# Patient Record
Sex: Female | Born: 1995 | Race: Asian | Hispanic: No | Marital: Married | Smoking: Never smoker
Health system: Southern US, Community
[De-identification: ages and names within clinical notes are randomized; demographics above are authoritative.]

## PROBLEM LIST (undated history)

## (undated) DIAGNOSIS — E079 Disorder of thyroid, unspecified: Secondary | ICD-10-CM

## (undated) HISTORY — DX: Disorder of thyroid, unspecified: E07.9

## (undated) HISTORY — PX: NO PAST SURGERIES: SHX2092

---
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
## 2013-12-30 ENCOUNTER — Other Ambulatory Visit: Payer: Self-pay

## 2013-12-30 ENCOUNTER — Other Ambulatory Visit: Payer: Self-pay | Admitting: Internal Medicine

## 2013-12-30 DIAGNOSIS — N6320 Unspecified lump in the left breast, unspecified quadrant: Secondary | ICD-10-CM

## 2013-12-30 DIAGNOSIS — N644 Mastodynia: Secondary | ICD-10-CM

## 2014-01-01 ENCOUNTER — Ambulatory Visit
Admission: RE | Admit: 2014-01-01 | Discharge: 2014-01-01 | Disposition: A | Discharge status: Home / Self Care | Payer: BC Managed Care – PPO | Source: Ambulatory Visit | Attending: Internal Medicine | Admitting: Internal Medicine

## 2014-01-01 ENCOUNTER — Other Ambulatory Visit: Payer: Self-pay

## 2014-01-01 ENCOUNTER — Encounter (INDEPENDENT_AMBULATORY_CARE_PROVIDER_SITE_OTHER): Payer: Self-pay

## 2014-01-01 DIAGNOSIS — N6320 Unspecified lump in the left breast, unspecified quadrant: Secondary | ICD-10-CM

## 2014-01-01 DIAGNOSIS — N644 Mastodynia: Secondary | ICD-10-CM

## 2014-04-20 ENCOUNTER — Other Ambulatory Visit: Payer: Self-pay | Admitting: Internal Medicine

## 2014-04-20 DIAGNOSIS — N644 Mastodynia: Secondary | ICD-10-CM

## 2014-04-20 DIAGNOSIS — N63 Unspecified lump in unspecified breast: Secondary | ICD-10-CM

## 2014-04-22 ENCOUNTER — Ambulatory Visit
Admission: RE | Admit: 2014-04-22 | Discharge: 2014-04-22 | Disposition: A | Discharge status: Home / Self Care | Payer: BLUE CROSS/BLUE SHIELD | Source: Ambulatory Visit | Attending: Internal Medicine | Admitting: Internal Medicine

## 2014-04-22 DIAGNOSIS — N644 Mastodynia: Secondary | ICD-10-CM

## 2014-04-22 DIAGNOSIS — N63 Unspecified lump in unspecified breast: Secondary | ICD-10-CM

## 2014-07-03 ENCOUNTER — Other Ambulatory Visit: Payer: Self-pay | Admitting: Internal Medicine

## 2014-07-03 ENCOUNTER — Ambulatory Visit
Admission: RE | Admit: 2014-07-03 | Discharge: 2014-07-03 | Disposition: A | Discharge status: Home / Self Care | Payer: BLUE CROSS/BLUE SHIELD | Source: Ambulatory Visit | Attending: Internal Medicine | Admitting: Internal Medicine

## 2014-07-03 ENCOUNTER — Other Ambulatory Visit: Payer: Self-pay

## 2014-07-03 ENCOUNTER — Encounter (INDEPENDENT_AMBULATORY_CARE_PROVIDER_SITE_OTHER): Payer: Self-pay

## 2014-07-03 DIAGNOSIS — D242 Benign neoplasm of left breast: Secondary | ICD-10-CM

## 2015-05-26 ENCOUNTER — Other Ambulatory Visit: Payer: Self-pay | Admitting: Internal Medicine

## 2015-05-26 DIAGNOSIS — N6320 Unspecified lump in the left breast, unspecified quadrant: Secondary | ICD-10-CM

## 2015-06-02 ENCOUNTER — Ambulatory Visit
Admission: RE | Admit: 2015-06-02 | Discharge: 2015-06-02 | Disposition: A | Discharge status: Home / Self Care | Payer: BLUE CROSS/BLUE SHIELD | Source: Ambulatory Visit | Attending: Internal Medicine | Admitting: Internal Medicine

## 2015-06-02 DIAGNOSIS — N6320 Unspecified lump in the left breast, unspecified quadrant: Secondary | ICD-10-CM

## 2015-10-02 ENCOUNTER — Ambulatory Visit (INDEPENDENT_AMBULATORY_CARE_PROVIDER_SITE_OTHER): Payer: BLUE CROSS/BLUE SHIELD

## 2015-10-02 ENCOUNTER — Ambulatory Visit (INDEPENDENT_AMBULATORY_CARE_PROVIDER_SITE_OTHER): Payer: BLUE CROSS/BLUE SHIELD | Admitting: Internal Medicine

## 2015-10-02 VITALS — BP 120/76 | HR 96 | Temp 98.4°F | Resp 18 | Ht 64.0 in | Wt 141.0 lb

## 2015-10-02 DIAGNOSIS — M5412 Radiculopathy, cervical region: Secondary | ICD-10-CM

## 2015-10-02 DIAGNOSIS — E039 Hypothyroidism, unspecified: Secondary | ICD-10-CM | POA: Diagnosis not present

## 2015-10-02 MED ORDER — CYCLOBENZAPRINE HCL 10 MG PO TABS: 10.0000 mg | ORAL_TABLET | Freq: Every day | ORAL | Status: DC

## 2015-10-02 MED ORDER — MELOXICAM 15 MG PO TABS: 15.0000 mg | ORAL_TABLET | Freq: Every day | ORAL | Status: DC

## 2015-10-02 NOTE — Patient Instructions (Addendum)
Heating pad for neck 3 times a day for 2-3 days Wear collar for comfort    IF you received an x-ray today, you will receive an invoice from [facility]. Please contact [facility] at [phone] with questions or concerns regarding your invoice.   IF you received labwork today, you will receive an invoice from [employer]. Please contact [facility] at [phone] with questions or concerns regarding your invoice.   Our billing staff will not be able to assist you with questions regarding bills from these companies.  You will be contacted with the lab results as soon as they are available. The fastest way to get your results is to activate your My Chart account. Instructions are located on the last page of this paperwork. If you have not heard from [place] regarding the results in 2 weeks, please contact this office.
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
## 2015-12-06 ENCOUNTER — Other Ambulatory Visit: Payer: Self-pay | Admitting: Internal Medicine

## 2015-12-06 DIAGNOSIS — N63 Unspecified lump in unspecified breast: Secondary | ICD-10-CM

## 2016-01-04 ENCOUNTER — Ambulatory Visit
Admission: RE | Admit: 2016-01-04 | Discharge: 2016-01-04 | Disposition: A | Discharge status: Home / Self Care | Payer: BLUE CROSS/BLUE SHIELD | Source: Ambulatory Visit | Attending: Internal Medicine | Admitting: Internal Medicine

## 2016-01-04 DIAGNOSIS — N63 Unspecified lump in unspecified breast: Secondary | ICD-10-CM

## 2018-04-03 ENCOUNTER — Other Ambulatory Visit: Payer: Self-pay | Admitting: Internal Medicine

## 2018-04-03 DIAGNOSIS — N6320 Unspecified lump in the left breast, unspecified quadrant: Secondary | ICD-10-CM

## 2018-04-16 ENCOUNTER — Ambulatory Visit
Admission: RE | Admit: 2018-04-16 | Discharge: 2018-04-16 | Disposition: A | Discharge status: Home / Self Care | Payer: BLUE CROSS/BLUE SHIELD | Source: Ambulatory Visit | Attending: Internal Medicine | Admitting: Internal Medicine

## 2018-04-16 DIAGNOSIS — N6320 Unspecified lump in the left breast, unspecified quadrant: Secondary | ICD-10-CM

## 2018-10-08 ENCOUNTER — Other Ambulatory Visit: Payer: Self-pay | Admitting: Gastroenterology

## 2018-10-08 DIAGNOSIS — R1084 Generalized abdominal pain: Secondary | ICD-10-CM

## 2018-10-08 DIAGNOSIS — R140 Abdominal distension (gaseous): Secondary | ICD-10-CM

## 2018-10-10 ENCOUNTER — Ambulatory Visit
Admission: RE | Admit: 2018-10-10 | Discharge: 2018-10-10 | Disposition: A | Discharge status: Home / Self Care | Payer: BC Managed Care – PPO | Source: Ambulatory Visit | Attending: Gastroenterology | Admitting: Gastroenterology

## 2018-10-10 DIAGNOSIS — R140 Abdominal distension (gaseous): Secondary | ICD-10-CM

## 2018-10-10 DIAGNOSIS — R1084 Generalized abdominal pain: Secondary | ICD-10-CM

## 2019-06-19 ENCOUNTER — Ambulatory Visit: Payer: BC Managed Care – PPO | Attending: Internal Medicine

## 2019-06-19 DIAGNOSIS — Z23 Encounter for immunization: Secondary | ICD-10-CM

## 2019-07-14 ENCOUNTER — Ambulatory Visit: Payer: BC Managed Care – PPO | Attending: Internal Medicine

## 2019-07-14 DIAGNOSIS — Z23 Encounter for immunization: Secondary | ICD-10-CM

## 2019-07-14 NOTE — Progress Notes (Signed)
   Covid-19 Vaccination Clinic  Name:  Allayah Egerer    MRN: [id number] DOB: 1996/01/10  07/14/2019  Ms. Elizardo was observed post Covid-19 immunization for 15 minutes without incident. She was provided with Vaccine Information Sheet and instruction to access the V-Safe system.   Ms. Theilen was instructed to call 911 with any severe reactions post vaccine: Marland Kitchen Difficulty breathing  . Swelling of face and throat  . A fast heartbeat  . A bad rash all over body  . Dizziness and weakness   Immunizations Administered    Name Date Dose VIS Date Route   Pfizer COVID-19 Vaccine 07/14/2019  1:06 PM 0.3 mL 05/21/2018 Intramuscular   Manufacturer: [place]   Lot: [id number]   [place]: [id number]

## 2019-12-03 IMAGING — US ULTRASOUND LEFT BREAST LIMITED
1 series · 4 of 4 positions shown · non-contrast
Comparison: Previous exam(s).

CLINICAL DATA: Patient presents for recheck of a known palpable
mass in the left breast, evaluated between December 2013 and December 2015 with ultrasound, diagnosed as a benign fibroadenoma.

EXAM:
ULTRASOUND OF THE LEFT BREAST

[Series 1: ultrasound left breast limited · 0.05mm/px · 4 of 4 slices shown]
[im 1/4]
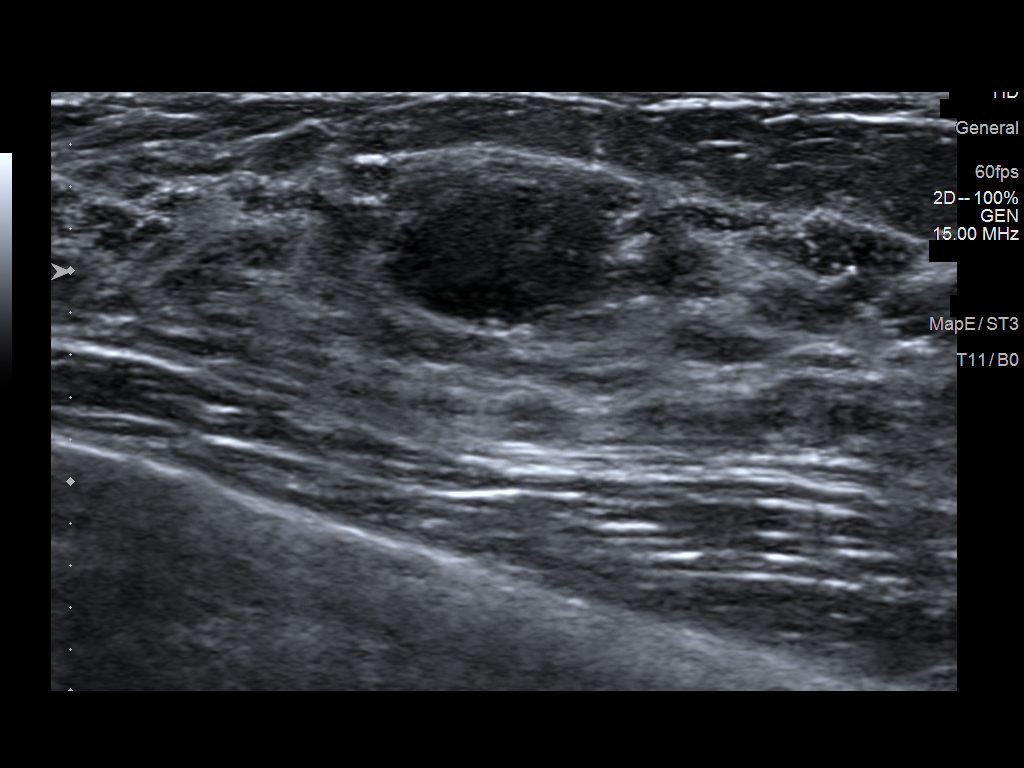
[im 2/4]
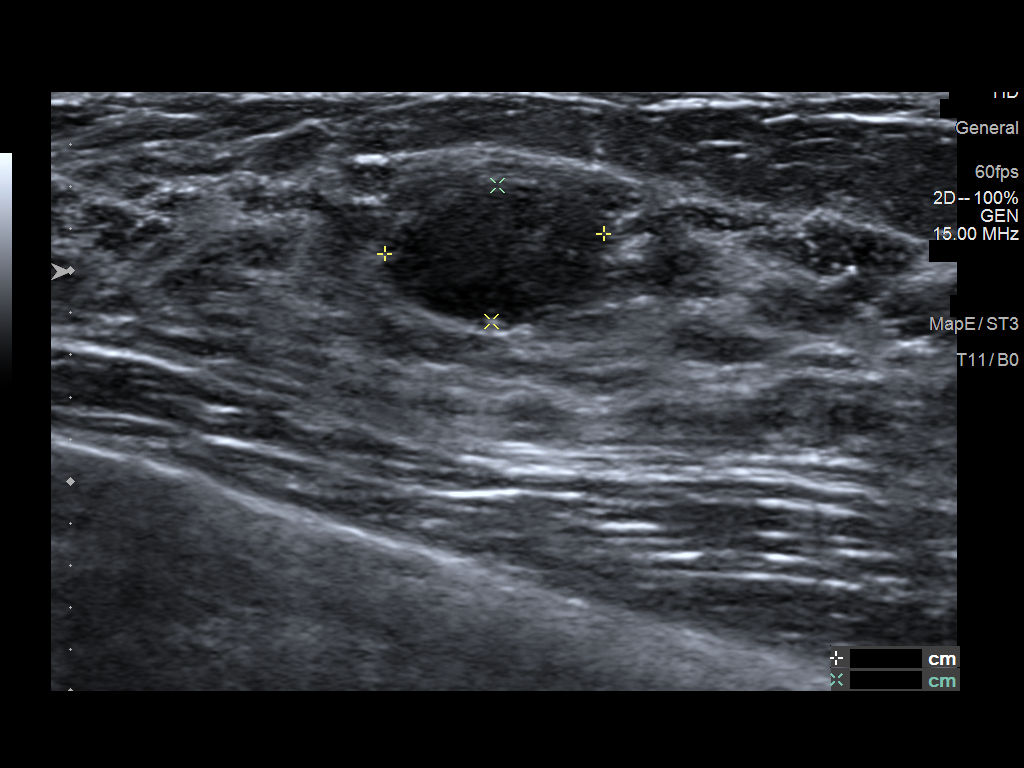
[im 3/4]
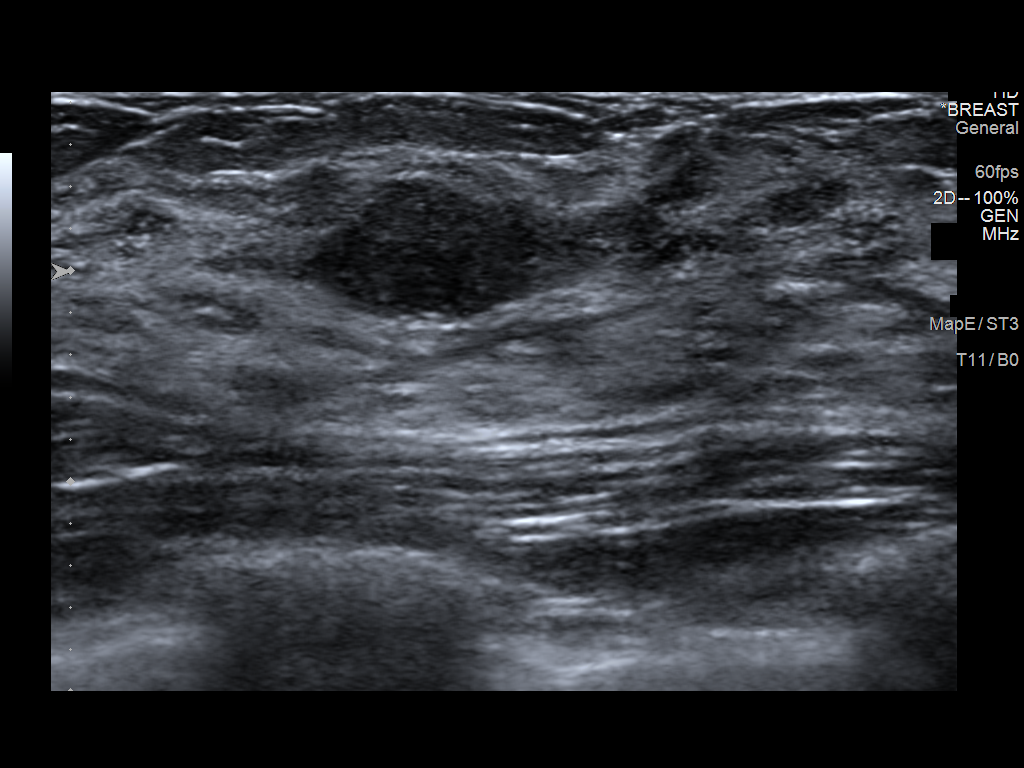
[im 4/4]
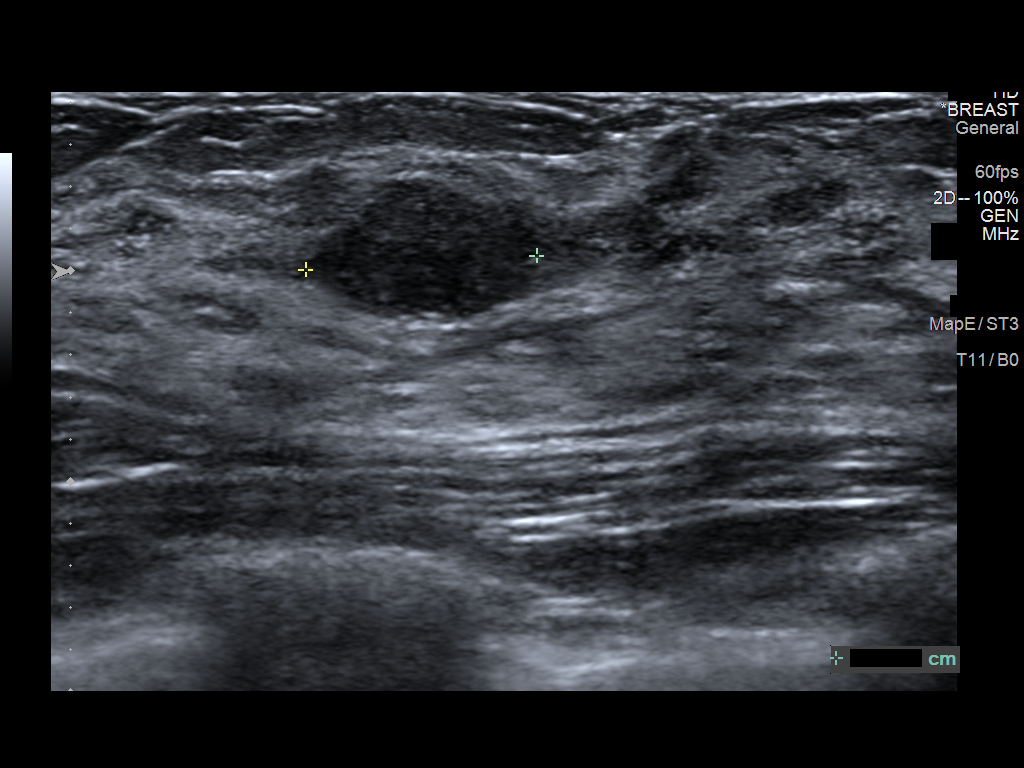

[4 of 4 positions shown; findings below may reference images not displayed]

FINDINGS: Targeted ultrasound is performed, showing oval, parallel, hypoechoic
circumscribed mass left breast at 11 o'clock, 4 cm the nipple,
measuring 10 x 7 x 11 mm, decreased in size from 19 x 8 x 13 mm in
December 2013.
IMPRESSION: Benign left breast fibroadenoma, decreased in size from the initial
exam dated 01/01/2014.

RECOMMENDATION:
Screening mammogram at age 38 unless there are persistent or
intervening clinical concerns. (Code:K5-R-DVS)

I have discussed the findings and recommendations with the patient.
Results were also provided in writing at the conclusion of the
visit. If applicable, a reminder letter will be sent to the patient
regarding the next appointment.

BI-RADS CATEGORY  2: Benign.

## 2020-07-28 IMAGING — US ULTRASOUND ABDOMEN COMPLETE
1 series · 14 of 25 positions shown · non-contrast
Comparison: None.

CLINICAL DATA: Abdominal pain and bloating 2 months.

EXAM:
ABDOMEN ULTRASOUND COMPLETE

[Series 1: ultrasound abdomen complete · 0.11mm/px · 14 of 92 slices shown]
[im 1/92]
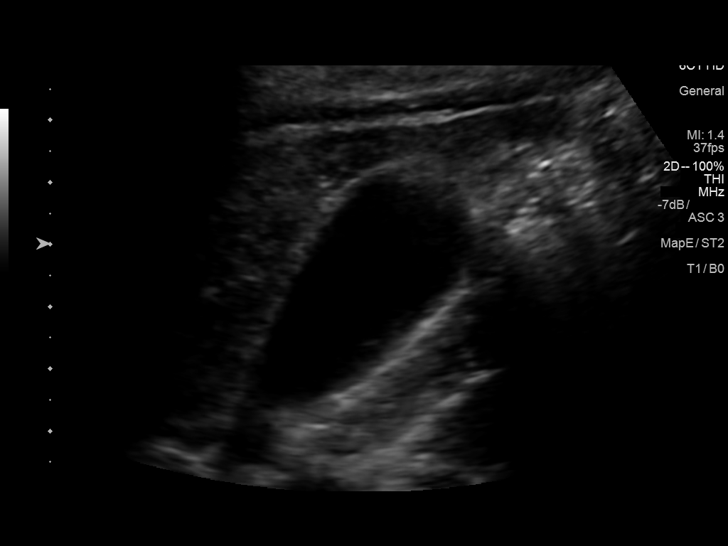
[im 8/92]
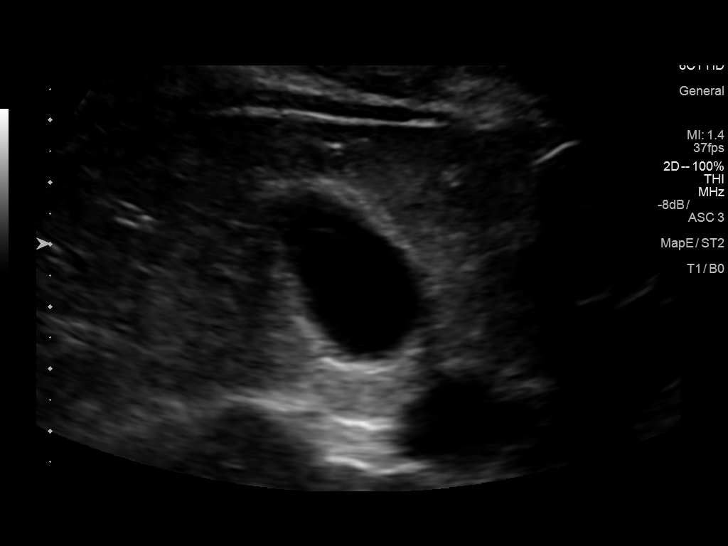
[im 16/92]
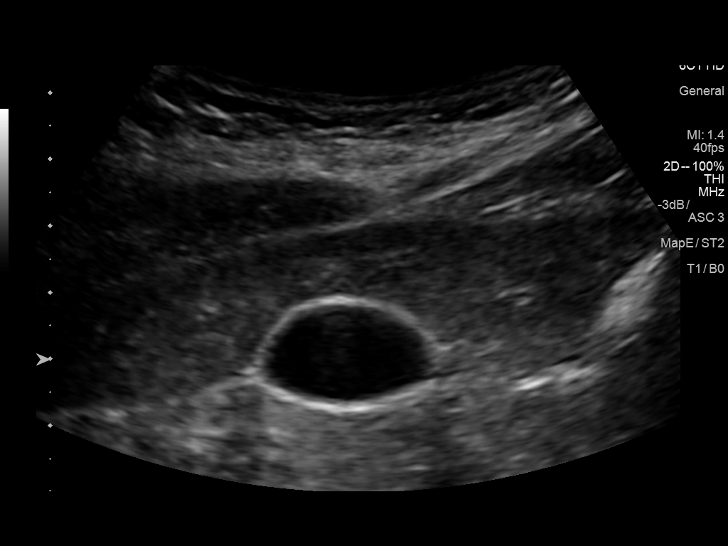
[im 23/92]
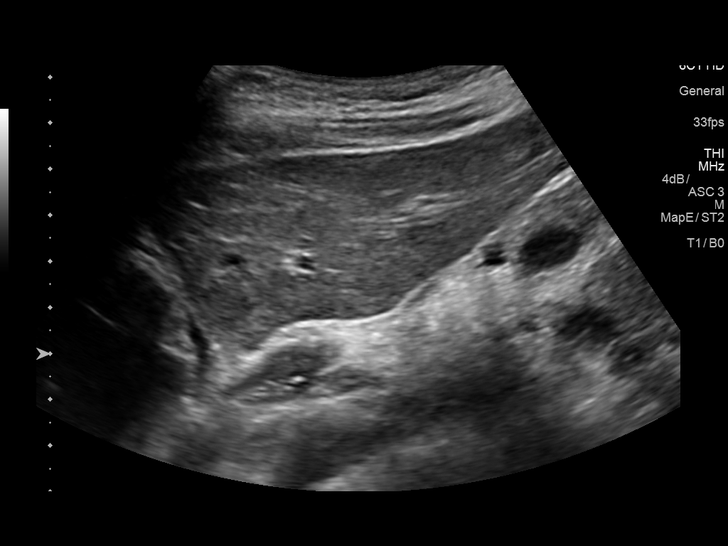
[im 31/92]
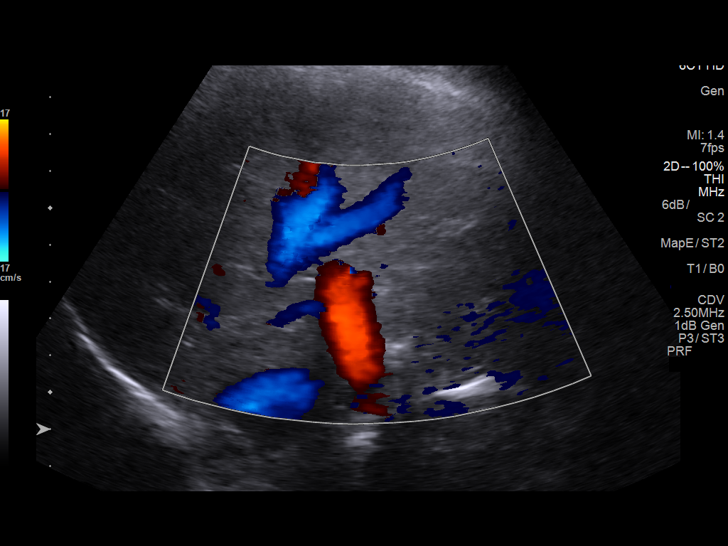
[im 35/92]
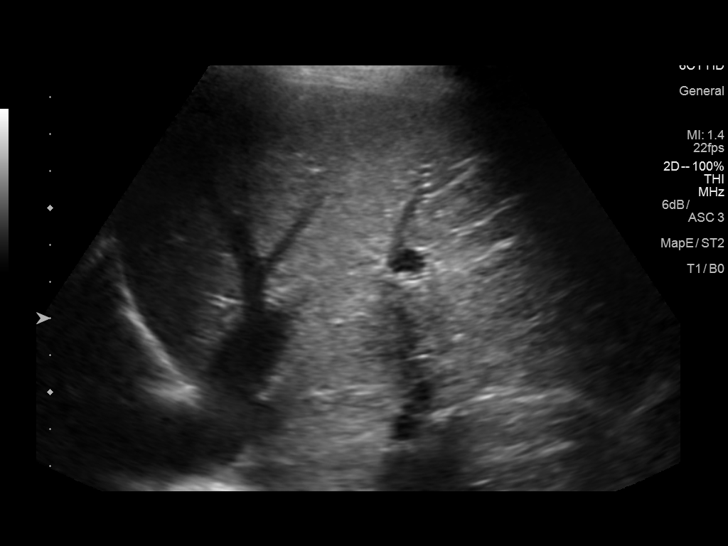
[im 42/92]
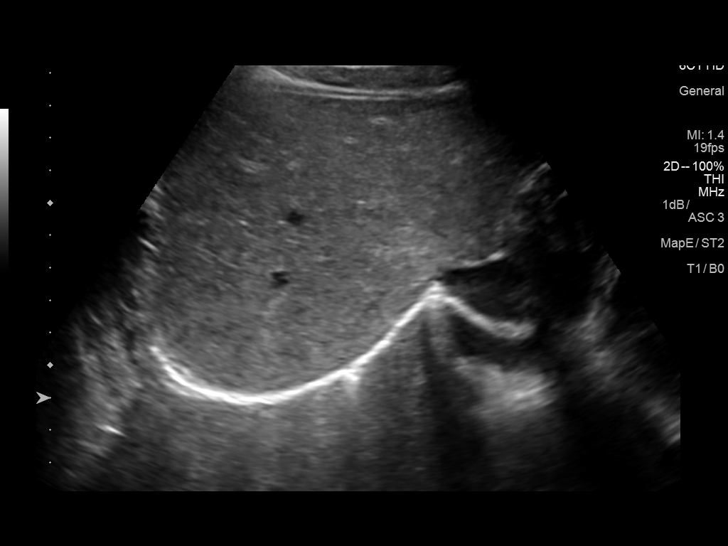
[im 50/92]
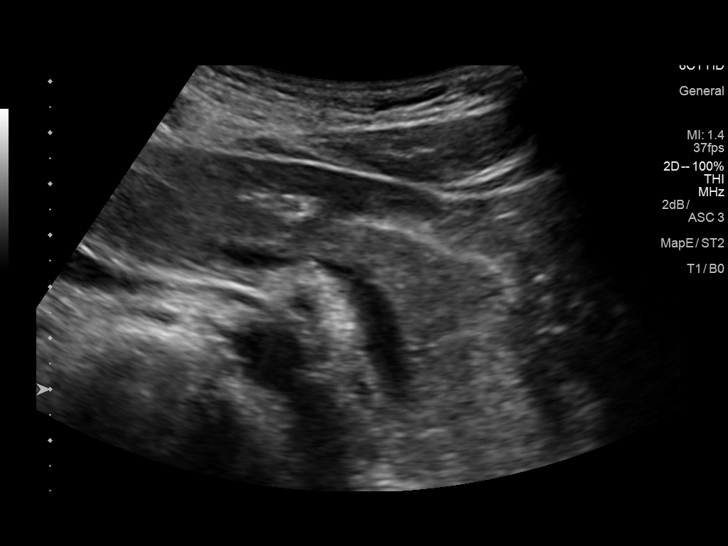
[im 57/92]
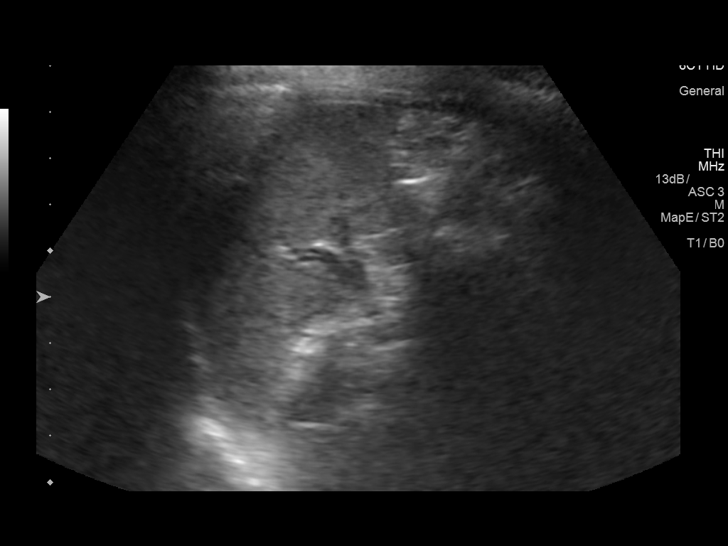
[im 61/92]
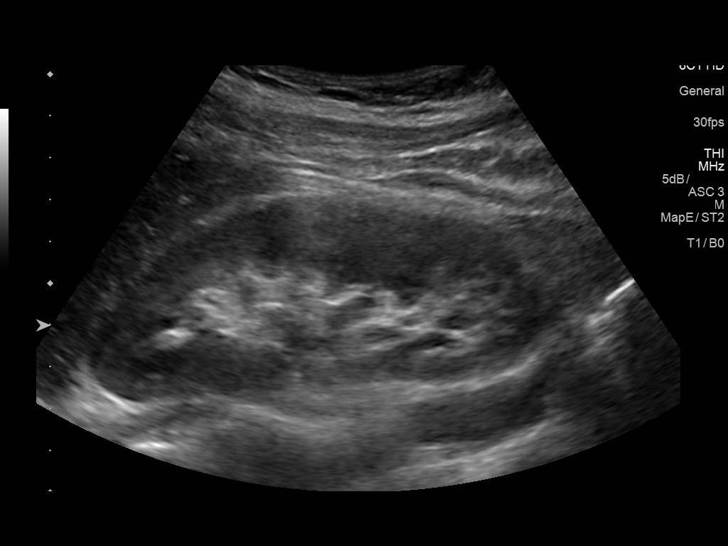
[im 69/92]
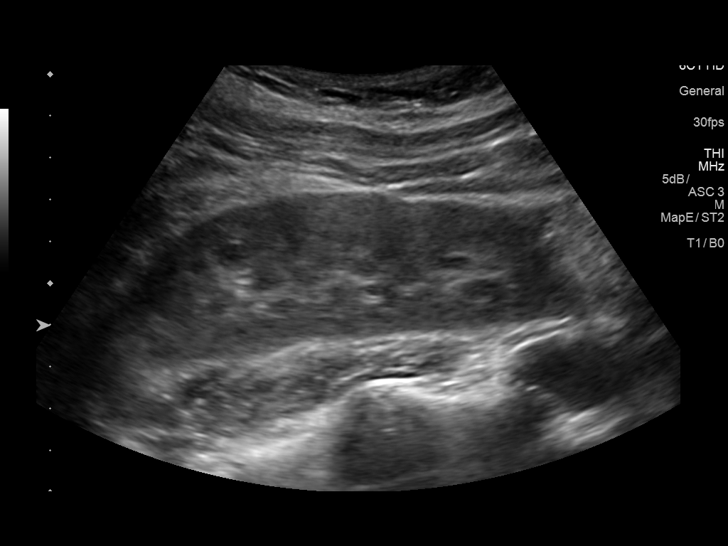
[im 76/92]
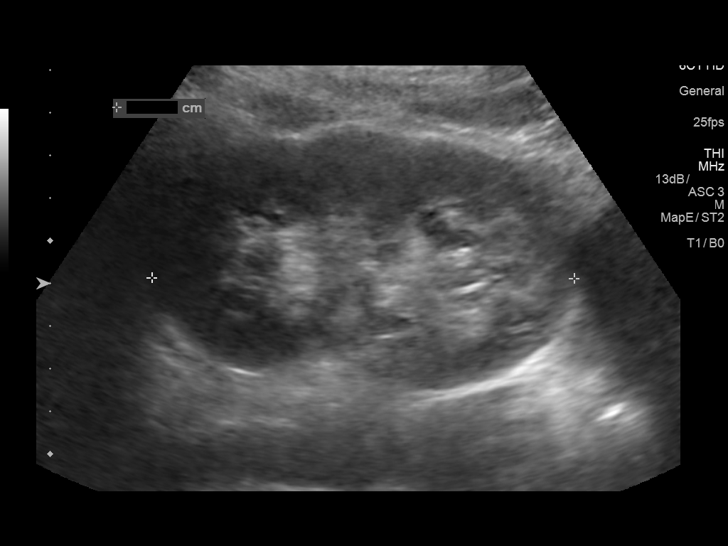
[im 84/92]
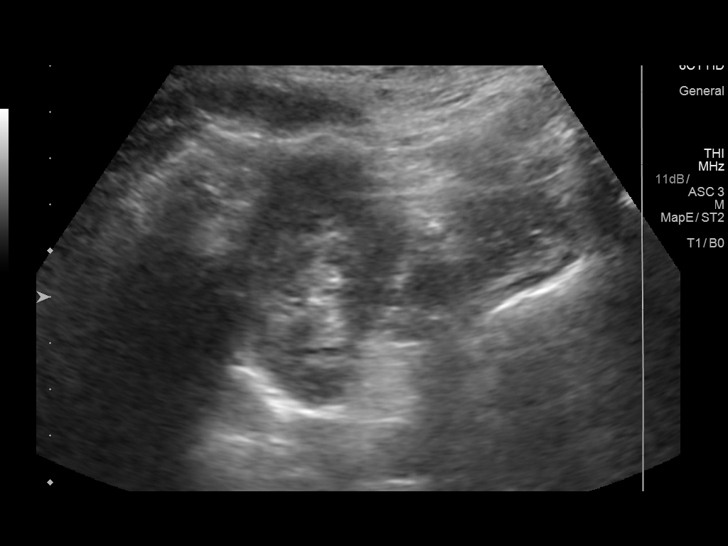
[im 92/92]
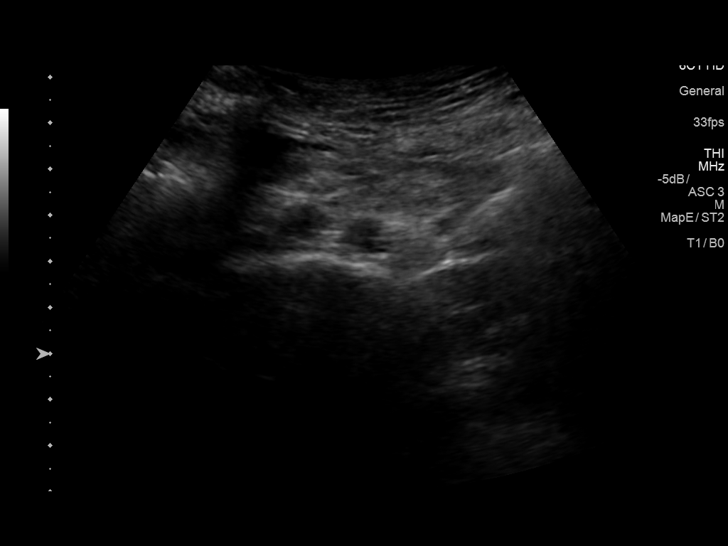

[14 of 25 positions shown; findings below may reference images not displayed]

FINDINGS: Gallbladder: No gallstones or wall thickening visualized. No
sonographic Murphy sign noted by sonographer.

Common bile duct: Diameter: 2.2 mm.

Liver: No focal lesion identified. Within normal limits in
parenchymal echogenicity. Portal vein is patent on color Doppler
imaging with normal direction of blood flow towards the liver.

IVC: No abnormality visualized.

Pancreas: Visualized portion unremarkable.

Spleen: Size and appearance within normal limits.

Right Kidney: Length: 11.1 cm. Echogenicity within normal limits. No
mass or hydronephrosis visualized.

Left Kidney: Length: 9.9 cm. Echogenicity within normal limits. No
mass or hydronephrosis visualized.

Abdominal aorta: No aneurysm visualized.

Other findings: None.
IMPRESSION: Normal abdominal ultrasound.

## 2021-04-06 ENCOUNTER — Other Ambulatory Visit: Payer: Self-pay | Admitting: Internal Medicine

## 2021-04-06 DIAGNOSIS — N6320 Unspecified lump in the left breast, unspecified quadrant: Secondary | ICD-10-CM

## 2021-04-06 DIAGNOSIS — N6310 Unspecified lump in the right breast, unspecified quadrant: Secondary | ICD-10-CM

## 2021-05-10 ENCOUNTER — Other Ambulatory Visit: Payer: BC Managed Care – PPO

## 2021-05-17 ENCOUNTER — Ambulatory Visit
Admission: RE | Admit: 2021-05-17 | Discharge: 2021-05-17 | Disposition: A | Discharge status: Home / Self Care | Payer: BC Managed Care – PPO | Source: Ambulatory Visit | Attending: Internal Medicine | Admitting: Internal Medicine

## 2021-05-17 ENCOUNTER — Ambulatory Visit
Admission: RE | Admit: 2021-05-17 | Discharge: 2021-05-17 | Disposition: A | Discharge status: Home / Self Care | Payer: Self-pay | Source: Ambulatory Visit | Attending: Internal Medicine | Admitting: Internal Medicine

## 2021-05-17 ENCOUNTER — Other Ambulatory Visit: Payer: Self-pay

## 2021-05-17 ENCOUNTER — Other Ambulatory Visit: Payer: Self-pay | Admitting: Internal Medicine

## 2021-05-17 DIAGNOSIS — N6310 Unspecified lump in the right breast, unspecified quadrant: Secondary | ICD-10-CM

## 2021-05-17 DIAGNOSIS — N6320 Unspecified lump in the left breast, unspecified quadrant: Secondary | ICD-10-CM

## 2021-11-16 ENCOUNTER — Inpatient Hospital Stay: Admission: RE | Admit: 2021-11-16 | Payer: BC Managed Care – PPO | Source: Ambulatory Visit

## 2021-11-16 ENCOUNTER — Other Ambulatory Visit: Payer: BC Managed Care – PPO

## 2021-12-14 ENCOUNTER — Ambulatory Visit
Admission: RE | Admit: 2021-12-14 | Discharge: 2021-12-14 | Disposition: A | Discharge status: Home / Self Care | Payer: BC Managed Care – PPO | Source: Ambulatory Visit | Attending: Internal Medicine | Admitting: Internal Medicine

## 2021-12-14 ENCOUNTER — Other Ambulatory Visit: Payer: Self-pay | Admitting: Internal Medicine

## 2021-12-14 DIAGNOSIS — N6320 Unspecified lump in the left breast, unspecified quadrant: Secondary | ICD-10-CM

## 2021-12-14 DIAGNOSIS — N6310 Unspecified lump in the right breast, unspecified quadrant: Secondary | ICD-10-CM

## 2022-06-12 ENCOUNTER — Other Ambulatory Visit: Payer: Self-pay | Admitting: Internal Medicine

## 2022-06-12 ENCOUNTER — Encounter: Payer: Self-pay | Admitting: Internal Medicine

## 2022-06-12 DIAGNOSIS — N6320 Unspecified lump in the left breast, unspecified quadrant: Secondary | ICD-10-CM

## 2022-06-12 DIAGNOSIS — N6310 Unspecified lump in the right breast, unspecified quadrant: Secondary | ICD-10-CM

## 2022-06-16 ENCOUNTER — Other Ambulatory Visit: Payer: BC Managed Care – PPO

## 2023-01-03 IMAGING — US US BREAST*R* LIMITED INC AXILLA
1 series · 8 of 8 positions shown · non-contrast
Comparison: Previous LEFT breast ultrasound dated 04/16/2018.

CLINICAL DATA: Patient describes having an outside study in [REDACTED]
which described a breast cyst with recommendation to follow-up after
returning to the [place]. Patient presents today for that
follow-up exam. Patient does not have the outside images or report.
Today's requisition from the ordering physician describes palpable
lumps in the upper breasts bilaterally.

History of a benign LEFT breast fibroadenoma described on diagnostic
ultrasound report dated 04/16/2018.
EXAM:
ULTRASOUND OF THE BILATERAL BREAST

[Series 1: us breast*right* limited inc axilla · 0.06mm/px · 8 of 8 slices shown]
[im 1/8]
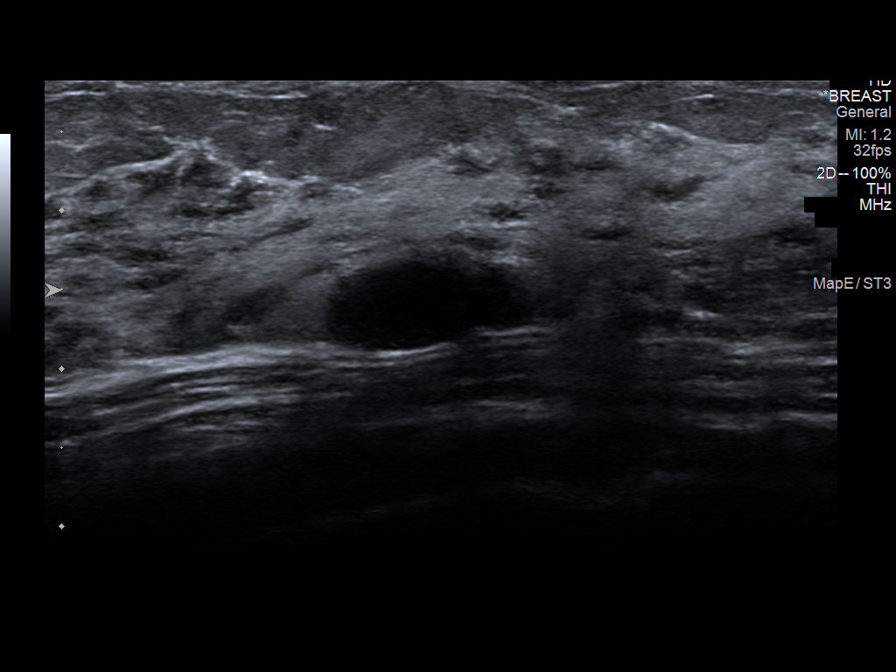
[im 2/8]
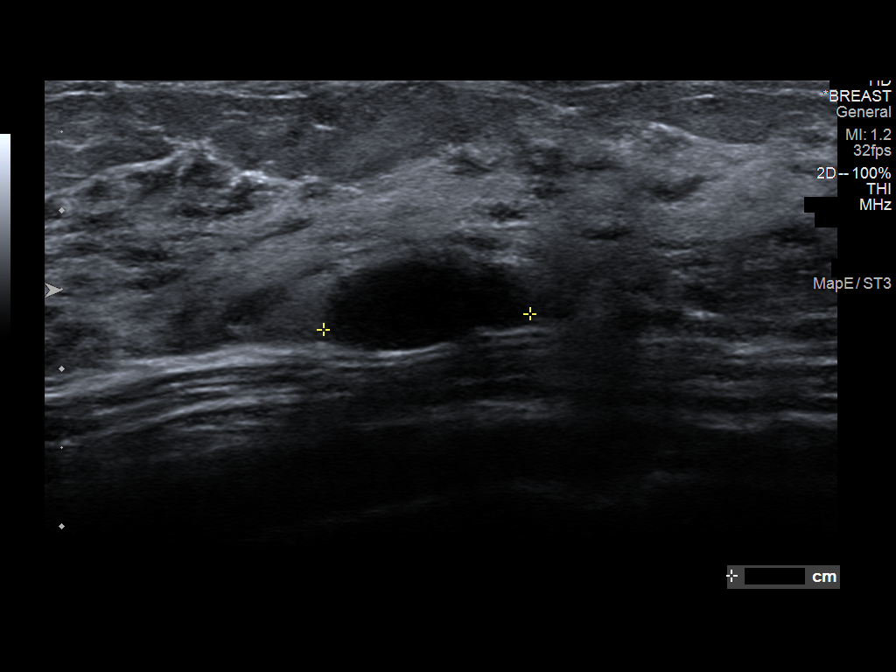
[im 3/8]
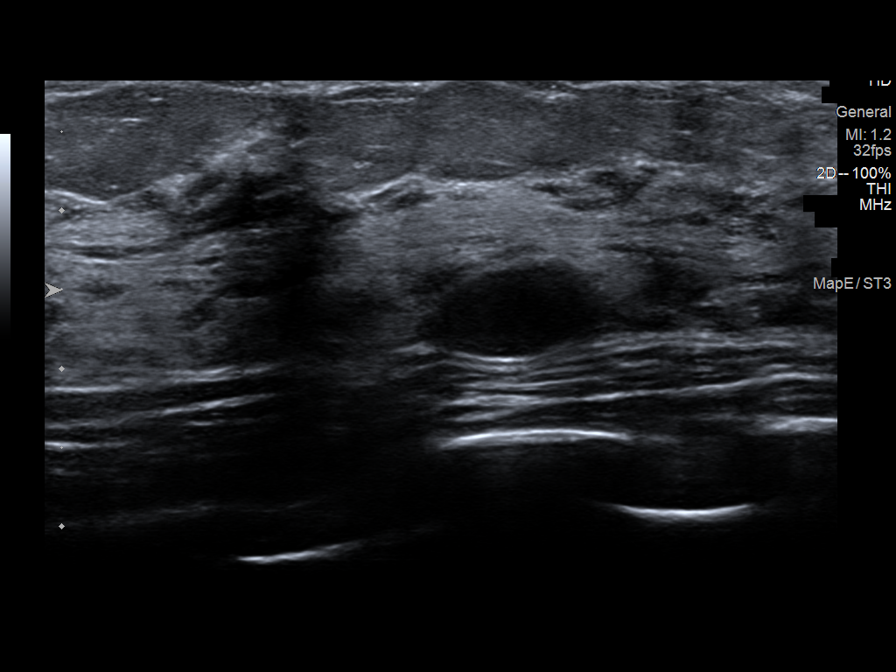
[im 4/8]
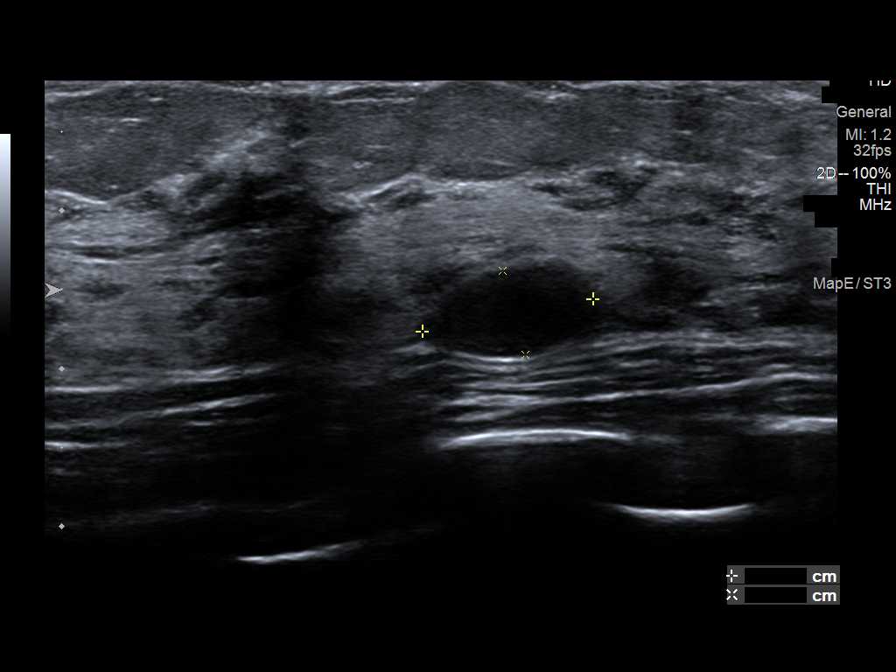
[im 5/8]
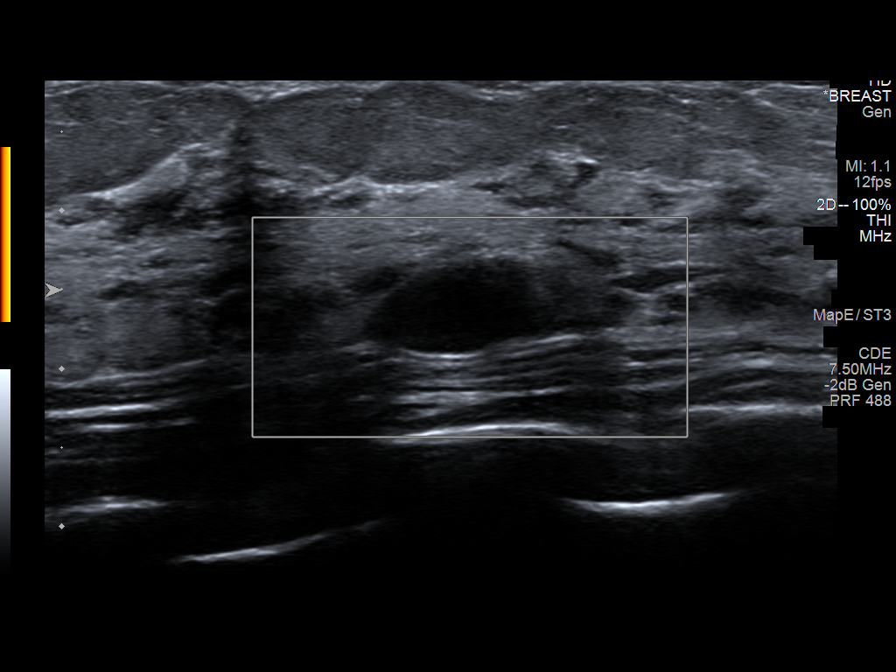
[im 6/8]
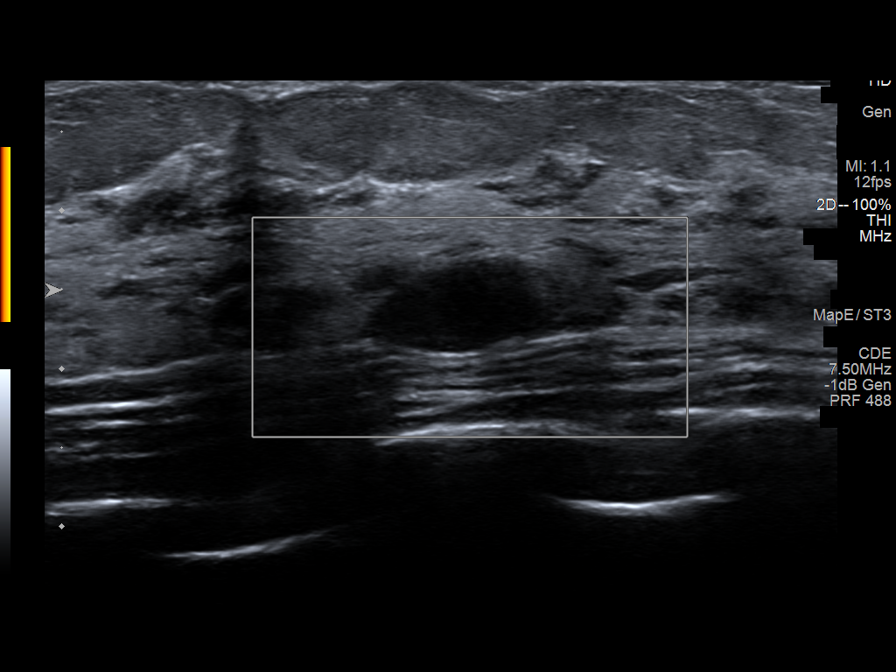
[im 7/8]
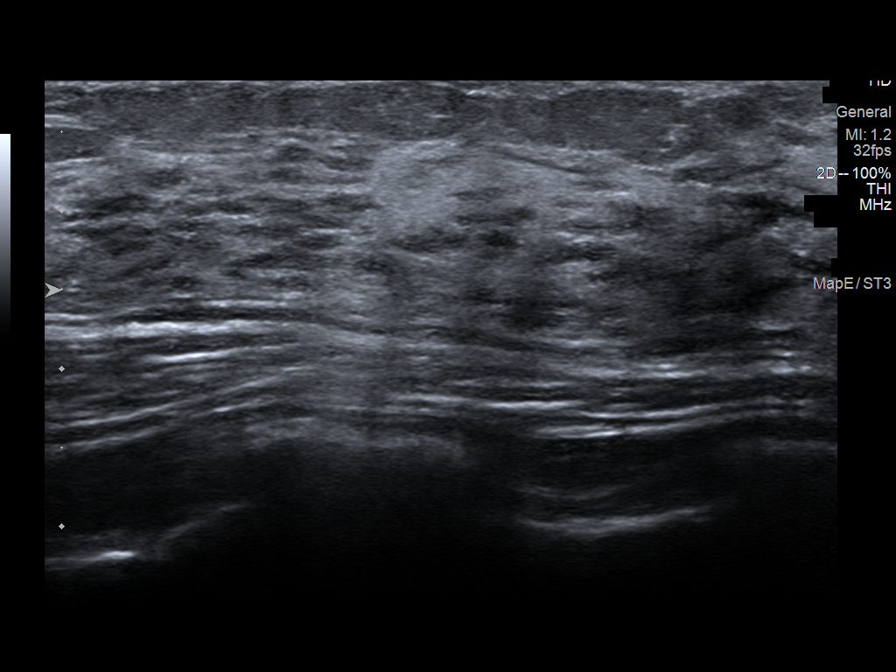
[im 8/8]
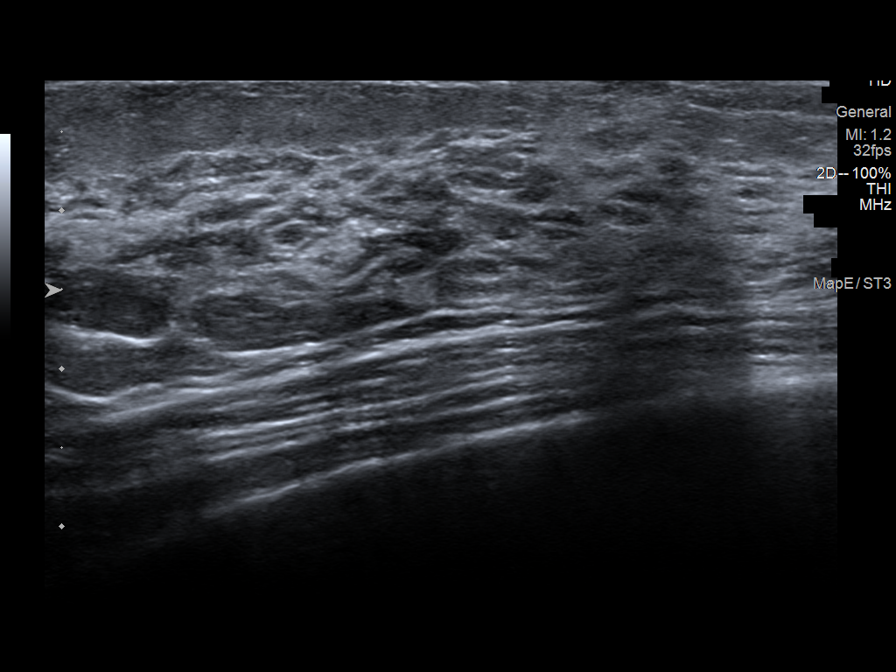

[8 of 8 positions shown; findings below may reference images not displayed]

FINDINGS: RIGHT breast: Targeted ultrasound is performed, showing an oval
circumscribed hypoechoic mass in the RIGHT breast at the 9:30
o'clock axis, 3 cm from the nipple, measuring 1.3 x 0.6 x 1.1 cm,
most suggestive of a minimally complicated cyst or benign
fibroadenoma.

Additional ultrasound evaluation of the upper RIGHT breast showed
only normal fibroglandular tissues and fat lobules throughout. No
additional solid or cystic mass.

LEFT breast: Targeted ultrasound is performed, showing an interval
decrease in size of the LEFT breast mass at the 11 o'clock axis, 4
cm from the nipple, corresponding to the previously described benign
fibroadenoma.

There is an additional oval circumscribed hypoechoic mass in the
LEFT breast at the 1 o'clock axis, 4 cm from the nipple, measuring
0.7 x 0.4 x 0.7 cm, most suggestive of a benign fibroadenoma.

Additional ultrasound evaluation of the remainder of the upper LEFT
breast showing only normal fibroglandular tissues and fat lobules
throughout. No additional solid or cystic mass.
IMPRESSION: 1. Probably benign mass within the RIGHT breast at the 9:30 o'clock
axis, 3 cm from the nipple, measuring 1.3 x 0.6 x 1.1 cm, most
likely a minimally complicated cyst or benign fibroadenoma.
Recommend follow-up ultrasound in 6 months to ensure stability.
2. Probably benign mass in the LEFT breast at the 1 o'clock axis, 4
cm from the nipple, measuring 0.7 x 0.4 x 0.7 cm, most likely a
benign fibroadenoma. Recommend follow-up ultrasound in 6 months to
ensure stability.

RECOMMENDATION:
Bilateral breast ultrasound in 6 months.

I have discussed the findings and recommendations with the patient.
If applicable, a reminder letter will be sent to the patient
regarding the next appointment.

BI-RADS CATEGORY  3: Probably benign.

## 2023-01-03 IMAGING — US US BREAST*L* LIMITED INC AXILLA
1 series · 12 of 12 positions shown · non-contrast
Comparison: Previous LEFT breast ultrasound dated 04/16/2018.

CLINICAL DATA: Patient describes having an outside study in [REDACTED]
which described a breast cyst with recommendation to follow-up after
returning to the [place]. Patient presents today for that
follow-up exam. Patient does not have the outside images or report.
Today's requisition from the ordering physician describes palpable
lumps in the upper breasts bilaterally.

History of a benign LEFT breast fibroadenoma described on diagnostic
ultrasound report dated 04/16/2018.
EXAM:
ULTRASOUND OF THE BILATERAL BREAST

[Series 1: us breast*left* limited inc axilla · 0.05mm/px · 12 of 12 slices shown]
[im 1/12]
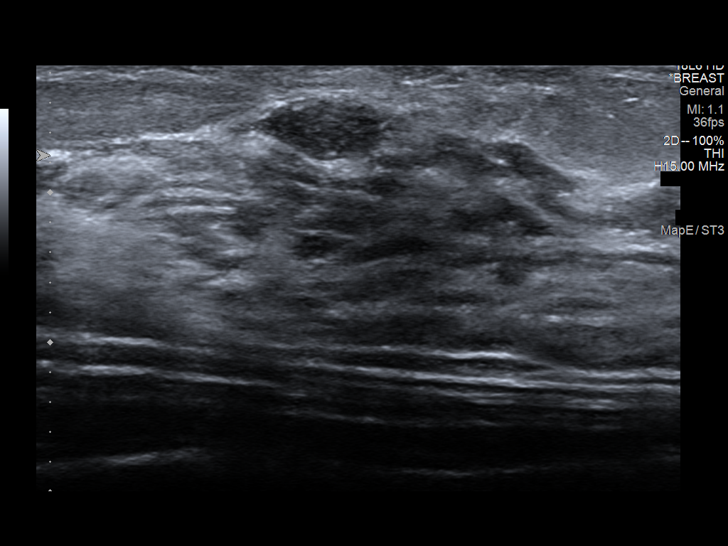
[im 2/12]
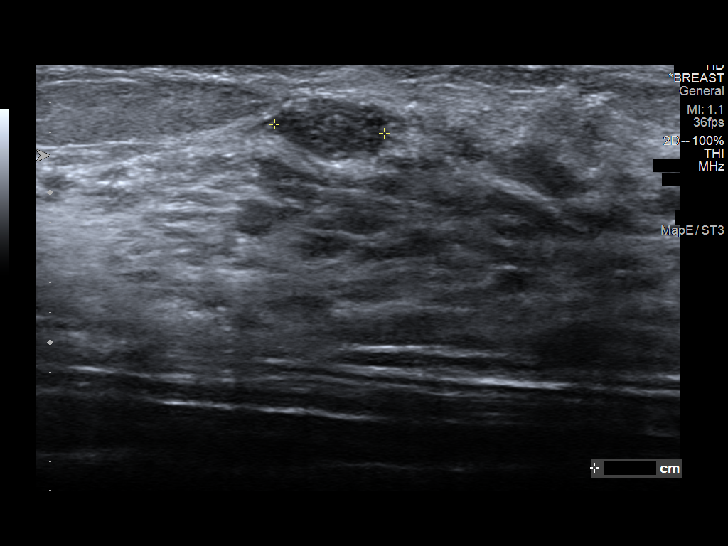
[im 3/12]
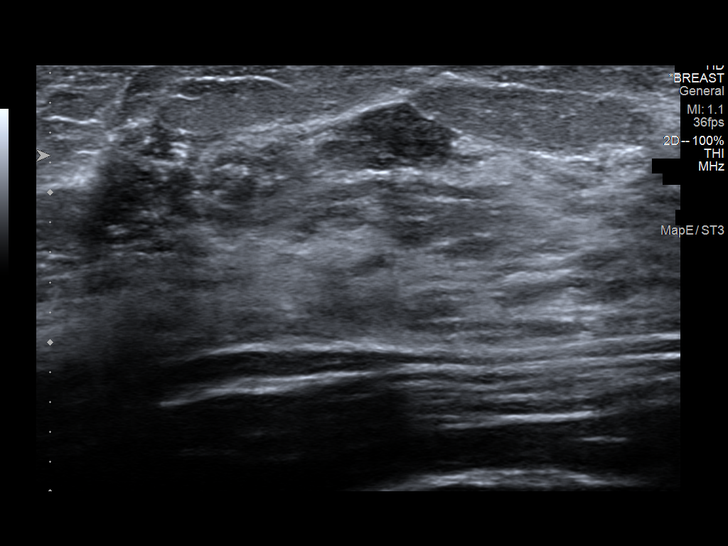
[im 4/12]
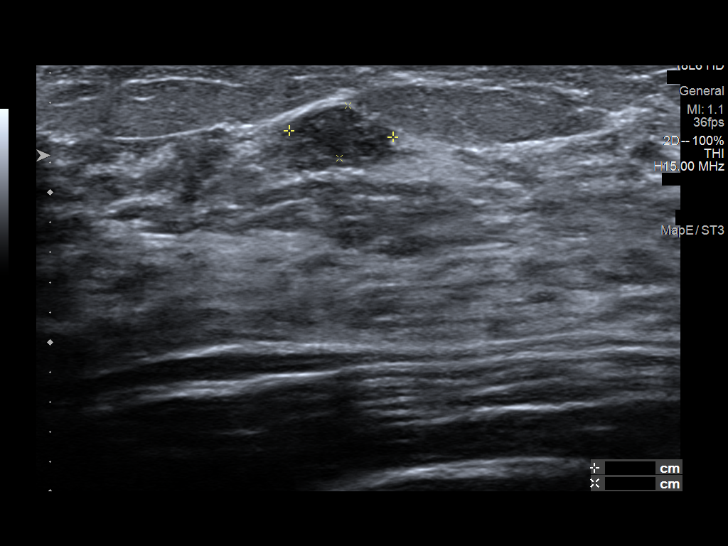
[im 5/12]
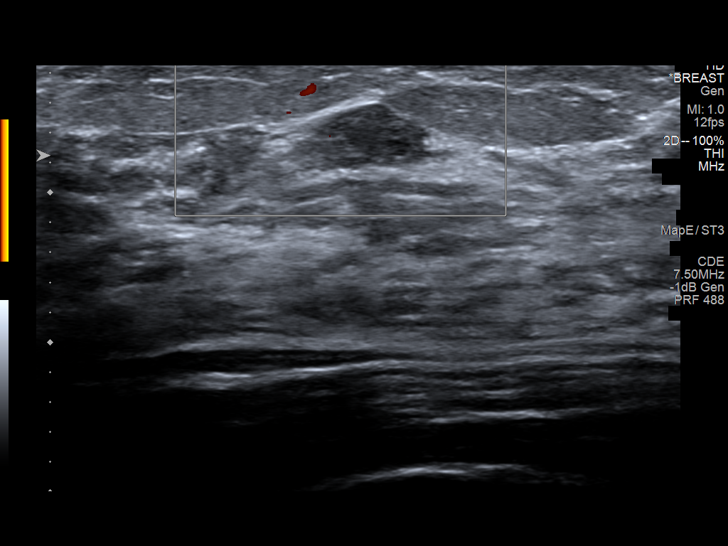
[im 6/12]
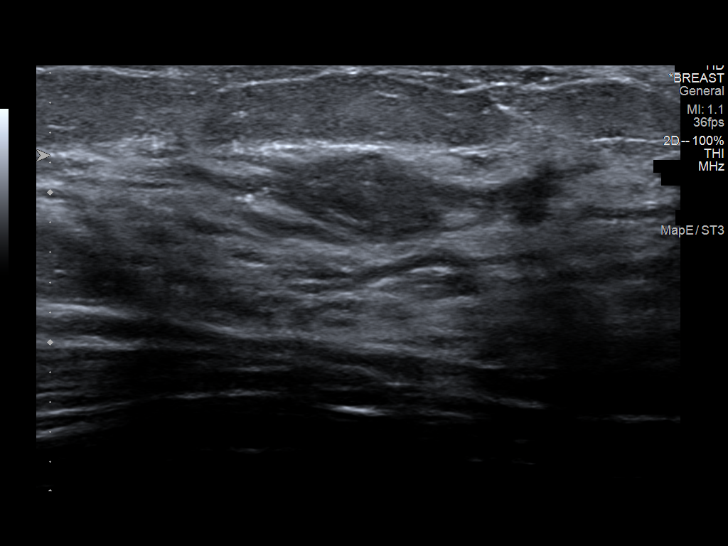
[im 7/12]
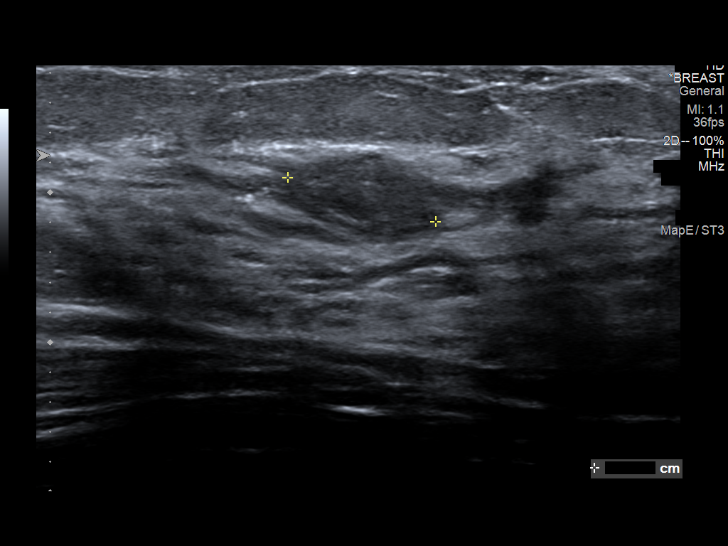
[im 8/12]
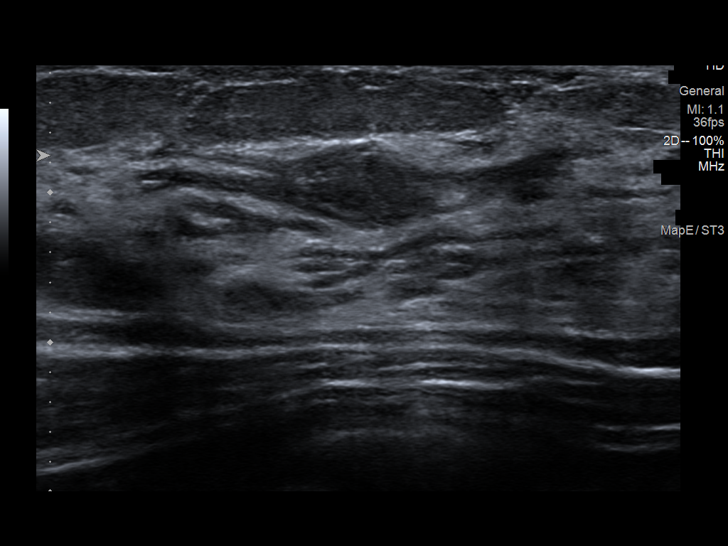
[im 9/12]
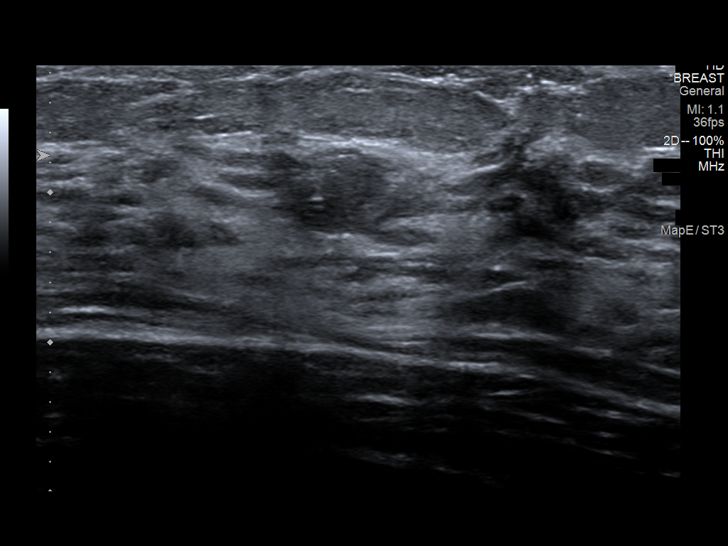
[im 10/12]
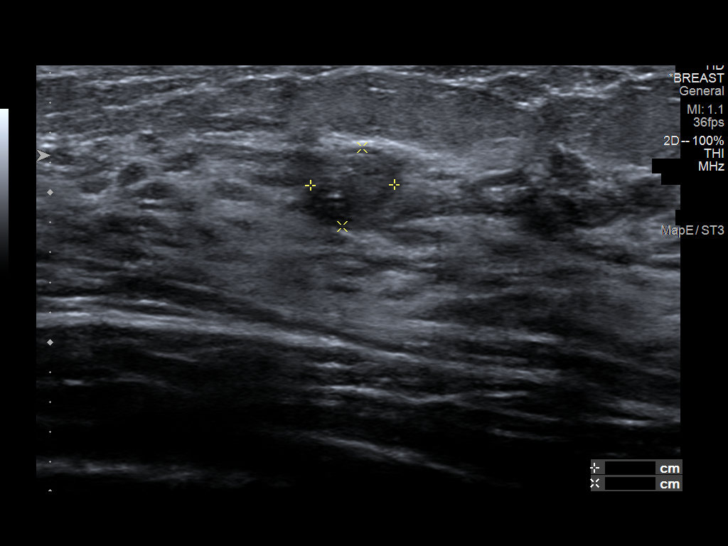
[im 11/12]
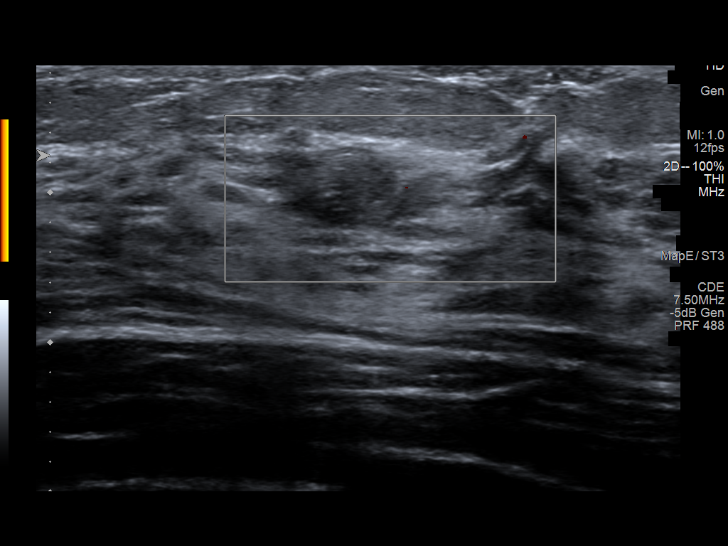
[im 12/12]
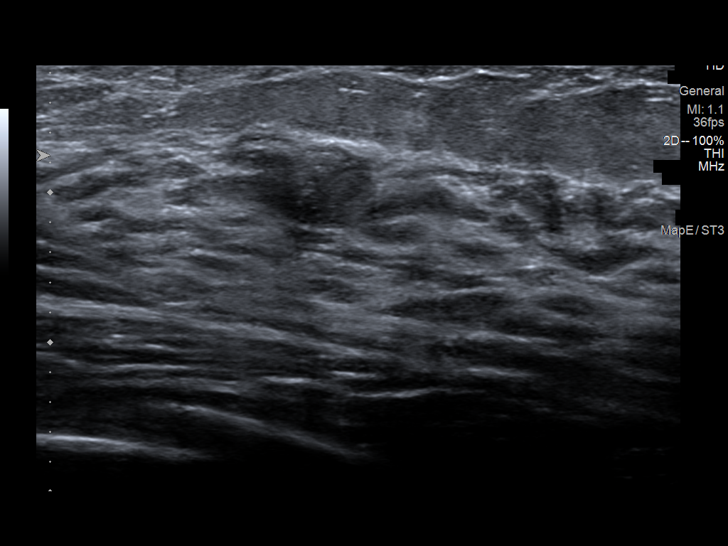

[12 of 12 positions shown; findings below may reference images not displayed]

FINDINGS: RIGHT breast: Targeted ultrasound is performed, showing an oval
circumscribed hypoechoic mass in the RIGHT breast at the 9:30
o'clock axis, 3 cm from the nipple, measuring 1.3 x 0.6 x 1.1 cm,
most suggestive of a minimally complicated cyst or benign
fibroadenoma.

Additional ultrasound evaluation of the upper RIGHT breast showed
only normal fibroglandular tissues and fat lobules throughout. No
additional solid or cystic mass.

LEFT breast: Targeted ultrasound is performed, showing an interval
decrease in size of the LEFT breast mass at the 11 o'clock axis, 4
cm from the nipple, corresponding to the previously described benign
fibroadenoma.

There is an additional oval circumscribed hypoechoic mass in the
LEFT breast at the 1 o'clock axis, 4 cm from the nipple, measuring
0.7 x 0.4 x 0.7 cm, most suggestive of a benign fibroadenoma.

Additional ultrasound evaluation of the remainder of the upper LEFT
breast showing only normal fibroglandular tissues and fat lobules
throughout. No additional solid or cystic mass.
IMPRESSION: 1. Probably benign mass within the RIGHT breast at the 9:30 o'clock
axis, 3 cm from the nipple, measuring 1.3 x 0.6 x 1.1 cm, most
likely a minimally complicated cyst or benign fibroadenoma.
Recommend follow-up ultrasound in 6 months to ensure stability.
2. Probably benign mass in the LEFT breast at the 1 o'clock axis, 4
cm from the nipple, measuring 0.7 x 0.4 x 0.7 cm, most likely a
benign fibroadenoma. Recommend follow-up ultrasound in 6 months to
ensure stability.

RECOMMENDATION:
Bilateral breast ultrasound in 6 months.

I have discussed the findings and recommendations with the patient.
If applicable, a reminder letter will be sent to the patient
regarding the next appointment.

BI-RADS CATEGORY  3: Probably benign.

## 2023-04-12 LAB — HEPATITIS C ANTIBODY: HCV Ab: NEGATIVE

## 2023-04-12 LAB — OB RESULTS CONSOLE HEPATITIS B SURFACE ANTIGEN: Hepatitis B Surface Ag: NEGATIVE

## 2023-04-12 LAB — OB RESULTS CONSOLE HIV ANTIBODY (ROUTINE TESTING): HIV: NONREACTIVE

## 2023-04-12 LAB — OB RESULTS CONSOLE GC/CHLAMYDIA
Chlamydia: NEGATIVE
Neisseria Gonorrhea: NEGATIVE

## 2023-04-12 LAB — OB RESULTS CONSOLE RUBELLA ANTIBODY, IGM: Rubella: IMMUNE

## 2023-10-26 LAB — OB RESULTS CONSOLE GBS
GBS: NEGATIVE
GBS: NEGATIVE

## 2023-11-05 ENCOUNTER — Inpatient Hospital Stay (HOSPITAL_BASED_OUTPATIENT_CLINIC_OR_DEPARTMENT_OTHER)

## 2023-11-05 ENCOUNTER — Encounter (HOSPITAL_COMMUNITY): Payer: Self-pay | Admitting: Obstetrics and Gynecology

## 2023-11-05 ENCOUNTER — Inpatient Hospital Stay (HOSPITAL_COMMUNITY)
Admission: AD | Admit: 2023-11-05 | Discharge: 2023-11-05 | Discharge status: Against Medical Advice | Attending: Obstetrics and Gynecology | Admitting: Obstetrics and Gynecology

## 2023-11-05 DIAGNOSIS — O4593 Premature separation of placenta, unspecified, third trimester: Secondary | ICD-10-CM

## 2023-11-05 DIAGNOSIS — Z3A37 37 weeks gestation of pregnancy: Secondary | ICD-10-CM

## 2023-11-05 DIAGNOSIS — O26893 Other specified pregnancy related conditions, third trimester: Secondary | ICD-10-CM | POA: Diagnosis not present

## 2023-11-05 DIAGNOSIS — M549 Dorsalgia, unspecified: Secondary | ICD-10-CM | POA: Diagnosis not present

## 2023-11-05 DIAGNOSIS — O4693 Antepartum hemorrhage, unspecified, third trimester: Principal | ICD-10-CM | POA: Diagnosis present

## 2023-11-05 DIAGNOSIS — O99283 Endocrine, nutritional and metabolic diseases complicating pregnancy, third trimester: Secondary | ICD-10-CM | POA: Diagnosis not present

## 2023-11-05 DIAGNOSIS — E039 Hypothyroidism, unspecified: Secondary | ICD-10-CM | POA: Insufficient documentation

## 2023-11-05 MED ORDER — SOD CITRATE-CITRIC ACID 500-334 MG/5ML PO SOLN: 30.0000 mL | ORAL | Status: DC | PRN

## 2023-11-05 MED ORDER — FENTANYL CITRATE (PF) 100 MCG/2ML IJ SOLN: 50.0000 ug | INTRAMUSCULAR | Status: DC | PRN

## 2023-11-05 MED ORDER — LACTATED RINGERS IV SOLN: 500.0000 mL | INTRAVENOUS | Status: DC | PRN

## 2023-11-05 MED ORDER — ONDANSETRON HCL 4 MG/2ML IJ SOLN: 4.0000 mg | Freq: Four times a day (QID) | INTRAMUSCULAR | Status: DC | PRN

## 2023-11-05 MED ORDER — LACTATED RINGERS IV SOLN: INTRAVENOUS | Status: DC

## 2023-11-05 MED ORDER — ACETAMINOPHEN 325 MG PO TABS: 650.0000 mg | ORAL_TABLET | ORAL | Status: DC | PRN

## 2023-11-05 MED ORDER — OXYCODONE-ACETAMINOPHEN 5-325 MG PO TABS: 1.0000 | ORAL_TABLET | ORAL | Status: DC | PRN

## 2023-11-05 MED ORDER — OXYTOCIN-SODIUM CHLORIDE 30-0.9 UT/500ML-% IV SOLN: 2.5000 [IU]/h | INTRAVENOUS | Status: DC

## 2023-11-05 MED ORDER — OXYTOCIN BOLUS FROM INFUSION: 333.0000 mL | Freq: Once | INTRAVENOUS | Status: DC

## 2023-11-05 MED ORDER — OXYTOCIN-SODIUM CHLORIDE 30-0.9 UT/500ML-% IV SOLN: 1.0000 m[IU]/min | INTRAVENOUS | Status: DC

## 2023-11-05 MED ORDER — OXYCODONE-ACETAMINOPHEN 5-325 MG PO TABS: 2.0000 | ORAL_TABLET | ORAL | Status: DC | PRN

## 2023-11-05 MED ORDER — TERBUTALINE SULFATE 1 MG/ML IJ SOLN: 0.2500 mg | Freq: Once | INTRAMUSCULAR | Status: DC | PRN

## 2023-11-05 MED ORDER — LIDOCAINE HCL (PF) 1 % IJ SOLN: 30.0000 mL | INTRAMUSCULAR | Status: DC | PRN

## 2023-11-05 NOTE — MAU Provider Note (Signed)
 Chief Complaint:  Vaginal Bleeding   HPI   Event Date/Time   First Provider Initiated Contact with Patient 11/05/23 1935      Roberta Luna is a 28 y.o. G1P0 at [redacted]w[redacted]d who presents to maternity admissions reporting vaginal bleeding.  Vaginal bleeding started suddenly around 1730.  She went to the bathroom and had vaginal bleeding, estimates the blood would have filled 1 menstrual pad.  Since that time, bleeding has slowed but not stopped.  Denies contractions, leaking of fluid.  Endorses good fetal movement.  She does endorse constant back pain that she states is similar to menstrual cramps.  Pregnancy Course: Receives care at [facility]. Prenatal records reviewed.  History significant for hypothyroidism.  Past Medical History:  Diagnosis Date   Thyroid disease    OB History  Gravida Para Term Preterm AB Living  1       SAB IAB Ectopic Multiple Live Births          # Outcome Date GA Lbr Len/2nd Weight Sex Type Anes PTL Lv  1 Current            Past Surgical History:  Procedure Laterality Date   NO PAST SURGERIES     History reviewed. No pertinent family history. Social History   Tobacco Use   Smoking status: Never  Vaping Use   Vaping status: Never Used  Substance Use Topics   Alcohol use: Not Currently   Drug use: Not Currently   Allergies  Allergen Reactions   Amoxicillin Hives   Penicillins    Medications Prior to Admission  Medication Sig Dispense Refill Last Dose/Taking   levothyroxine (SYNTHROID, LEVOTHROID) 75 MCG tablet Take 75 mcg by mouth daily before breakfast.   11/05/2023 Morning   Prenatal Vit-Fe Fumarate-FA (MULTIVITAMIN-PRENATAL) 27-0.8 MG TABS tablet Take 1 tablet by mouth daily at 12 noon.   11/04/2023 Evening   cyclobenzaprine  (FLEXERIL ) 10 MG tablet Take 1 tablet (10 mg total) by mouth at bedtime. As muscle relaxer 10 tablet 0    ibuprofen (ADVIL,MOTRIN) 100 MG tablet Take 100 mg by mouth every 6 (six) hours as needed for fever.      meloxicam   (MOBIC ) 15 MG tablet Take 1 tablet (15 mg total) by mouth daily. 15 tablet 0     I have reviewed patient's Past Medical Hx, Surgical Hx, Family Hx, Social Hx, medications and allergies.   ROS  Pertinent items noted in HPI and remainder of comprehensive ROS otherwise negative.   PHYSICAL EXAM  Patient Vitals for the past 24 hrs:  BP Temp Temp src Pulse Resp SpO2 Height Weight  11/05/23 2011 -- -- -- -- -- 99 % -- --  11/05/23 1915 -- -- -- -- -- -- 5' 5 (1.651 m) 88.5 kg  11/05/23 1914 123/82 98.7 F (37.1 C) Oral (!) 112 16 99 % -- --    Constitutional: Well-developed, well-nourished female in no acute distress.  HEENT: atraumatic, normocephalic. Neck has normal ROM. EOM intact. Cardiovascular: normal rate & rhythm, warm and well-perfused Respiratory: normal effort, no problems with respiration noted GI: Abd soft, non-tender, non-distended MSK: Extremities nontender, no edema, normal ROM Skin: warm and dry. Acyanotic, no jaundice or pallor. Neurologic: Alert and oriented x 4. No abnormal coordination. Psychiatric: Normal mood. Speech not slurred, not rapid/pressured. Patient is cooperative. GU: no CVA tenderness Pelvic exam: VULVA: normal appearing vulva with no masses, tenderness or lesions, VAGINA: blood clots, no watery blood, CERVIX: cervix obscured by blood clots, no pooling,  exam chaperoned by Maurilio Hover RN.  Dilation: 1 Effacement (%): Thick Station: -3 Exam by:: Joesph Sear, PA  Fetal Tracing: Baseline FHR: 135 per minute Fetal heart variability: moderate Fetal Heart Rate accelerations: yes Fetal Heart Rate decelerations: none Fetal Non-stress Test: Category I (reactive) Toco: irregular uterine contractions   Labs: No results found for this or any previous visit (from the past 24 hours).  Imaging:  No results found.  MDM & MAU COURSE  MDM: High  MAU Course: -Mild tachycardia. -More blood than would be expected for bloody show on speculum exam, cervix 1  cm/Thick/-3. -Uterine contractions on toco. -Discussed with Dr. Jayne, recommends admission. Called Dr. Sudie on-call for [facility], agrees with admission and will put in orders.  Differential diagnosis considered for 3rd trimester vaginal bleeding includes but is not limited to: labor, placenta previa, placental abruption, infection  No orders of the defined types were placed in this encounter.  No orders of the defined types were placed in this encounter.   ASSESSMENT   1. Vaginal bleeding in pregnancy, third trimester   2. [redacted] weeks gestation of pregnancy   3. Indication for care in labor and delivery, antepartum     PLAN  Admit to L&D, care transferred to Dr. Sudie.   Allergies as of 11/05/2023 - Review Complete 11/05/2023  Allergen Reaction Noted   Amoxicillin Hives 11/05/2023   Penicillins  10/02/2015     Joesph DELENA Sear, PA

## 2023-11-05 NOTE — MAU Note (Signed)
..  Roberta Luna is a 28 y.o. at [redacted]w[redacted]d here in MAU reporting: bleeding that started suddenly today around 1730. She estimates that the blood would have filled one menstrual pad.   Denies contractions, leaking fluid. Endorses +FM.   Pain score: 0/10 Vitals:   11/05/23 1914  BP: 123/82  Pulse: (!) 112  Resp: 16  Temp: 98.7 F (37.1 C)  SpO2: 99%     FHT:145 Lab orders placed from triage:  na

## 2023-11-20 ENCOUNTER — Ambulatory Visit (INDEPENDENT_AMBULATORY_CARE_PROVIDER_SITE_OTHER): Payer: Self-pay | Admitting: Pediatrics

## 2023-11-20 ENCOUNTER — Inpatient Hospital Stay (HOSPITAL_COMMUNITY)
Admission: AD | Admit: 2023-11-20 | Discharge: 2023-11-24 | DRG: 788 | Disposition: A | Discharge status: Home / Self Care | Attending: Obstetrics and Gynecology | Admitting: Obstetrics and Gynecology

## 2023-11-20 DIAGNOSIS — O480 Post-term pregnancy: Secondary | ICD-10-CM | POA: Diagnosis not present

## 2023-11-20 DIAGNOSIS — E039 Hypothyroidism, unspecified: Secondary | ICD-10-CM | POA: Diagnosis present

## 2023-11-20 DIAGNOSIS — O328XX0 Maternal care for other malpresentation of fetus, not applicable or unspecified: Secondary | ICD-10-CM | POA: Diagnosis present

## 2023-11-20 DIAGNOSIS — Z3A40 40 weeks gestation of pregnancy: Secondary | ICD-10-CM | POA: Diagnosis not present

## 2023-11-20 DIAGNOSIS — O99284 Endocrine, nutritional and metabolic diseases complicating childbirth: Secondary | ICD-10-CM | POA: Diagnosis present

## 2023-11-20 DIAGNOSIS — O26893 Other specified pregnancy related conditions, third trimester: Secondary | ICD-10-CM | POA: Diagnosis present

## 2023-11-20 DIAGNOSIS — O99214 Obesity complicating childbirth: Secondary | ICD-10-CM | POA: Diagnosis present

## 2023-11-20 DIAGNOSIS — Z3493 Encounter for supervision of normal pregnancy, unspecified, third trimester: Principal | ICD-10-CM

## 2023-11-20 DIAGNOSIS — Z7681 Expectant parent(s) prebirth pediatrician visit: Secondary | ICD-10-CM | POA: Insufficient documentation

## 2023-11-20 DIAGNOSIS — O321XX0 Maternal care for breech presentation, not applicable or unspecified: Secondary | ICD-10-CM | POA: Diagnosis not present

## 2023-11-20 LAB — CBC
HCT: 37.4 % (ref 36.0–46.0)
Hemoglobin: 12.5 g/dL (ref 12.0–15.0)
MCH: 25.8 pg — ABNORMAL LOW (ref 26.0–34.0)
MCHC: 33.4 g/dL (ref 30.0–36.0)
MCV: 77.3 fL — ABNORMAL LOW (ref 80.0–100.0)
Platelets: 227 K/uL (ref 150–400)
RBC: 4.84 MIL/uL (ref 3.87–5.11)
RDW: 14.3 % (ref 11.5–15.5)
WBC: 16.8 K/uL — ABNORMAL HIGH (ref 4.0–10.5)
nRBC: 0 % (ref 0.0–0.2)

## 2023-11-20 LAB — TYPE AND SCREEN
ABO/RH(D): O POS
Antibody Screen: NEGATIVE

## 2023-11-20 MED ORDER — SOD CITRATE-CITRIC ACID 500-334 MG/5ML PO SOLN: 30.0000 mL | ORAL | Status: DC | PRN

## 2023-11-20 MED ORDER — OXYTOCIN-SODIUM CHLORIDE 30-0.9 UT/500ML-% IV SOLN: 2.5000 [IU]/h | INTRAVENOUS | Status: DC

## 2023-11-20 MED ORDER — OXYTOCIN BOLUS FROM INFUSION: 333.0000 mL | Freq: Once | INTRAVENOUS | Status: DC

## 2023-11-20 MED ORDER — OXYTOCIN-SODIUM CHLORIDE 30-0.9 UT/500ML-% IV SOLN: 1.0000 m[IU]/min | INTRAVENOUS | Status: DC

## 2023-11-20 MED ORDER — LACTATED RINGERS IV SOLN: INTRAVENOUS | Status: DC

## 2023-11-20 MED ORDER — LACTATED RINGERS IV SOLN
500.0000 mL | INTRAVENOUS | Status: DC | PRN
  Administered 2023-11-21: 500 mL via INTRAVENOUS

## 2023-11-20 MED ORDER — ONDANSETRON HCL 4 MG/2ML IJ SOLN: 4.0000 mg | Freq: Four times a day (QID) | INTRAMUSCULAR | Status: DC | PRN

## 2023-11-20 MED ORDER — MISOPROSTOL 50MCG HALF TABLET: 50.0000 ug | ORAL_TABLET | ORAL | Status: DC | PRN

## 2023-11-20 MED ORDER — ACETAMINOPHEN 325 MG PO TABS: 650.0000 mg | ORAL_TABLET | ORAL | Status: DC | PRN

## 2023-11-20 MED ORDER — LIDOCAINE HCL (PF) 1 % IJ SOLN: 30.0000 mL | INTRAMUSCULAR | Status: DC | PRN

## 2023-11-20 MED ORDER — OXYCODONE-ACETAMINOPHEN 5-325 MG PO TABS: 1.0000 | ORAL_TABLET | ORAL | Status: DC | PRN

## 2023-11-20 MED ORDER — OXYCODONE-ACETAMINOPHEN 5-325 MG PO TABS: 2.0000 | ORAL_TABLET | ORAL | Status: DC | PRN

## 2023-11-20 MED ORDER — TERBUTALINE SULFATE 1 MG/ML IJ SOLN: 0.2500 mg | Freq: Once | INTRAMUSCULAR | Status: DC | PRN

## 2023-11-20 NOTE — MAU Note (Signed)
 Roberta Luna is a 28 y.o. at [redacted]w[redacted]d here in MAU reporting ctxs since 12n and hour after membrane sweep after which she was 3-4cm. Reports good FM and scant bloody show. No LOF  LMP: na Onset of complaint: 12n Pain score: 9 Vitals:   11/20/23 2030 11/20/23 2033  BP:  125/84  Pulse: 75   Resp: 17   Temp: 97.8 F (36.6 C)   SpO2: 100%      FHT: 150  Lab orders placed from triage: labor eval

## 2023-11-20 NOTE — MAU Note (Signed)

## 2023-11-20 NOTE — Plan of Care (Signed)

## 2023-11-20 NOTE — Progress Notes (Signed)
 Prenatal counseling for impending newborn done-- Z76.81

## 2023-11-21 ENCOUNTER — Encounter (HOSPITAL_COMMUNITY): Payer: Self-pay | Admitting: Obstetrics and Gynecology

## 2023-11-21 ENCOUNTER — Inpatient Hospital Stay (HOSPITAL_COMMUNITY): Admitting: Certified Registered"

## 2023-11-21 ENCOUNTER — Other Ambulatory Visit: Payer: Self-pay

## 2023-11-21 ENCOUNTER — Encounter (HOSPITAL_COMMUNITY)
Admission: AD | Disposition: A | Discharge status: Home / Self Care | Payer: Self-pay | Source: Home / Self Care | Attending: Obstetrics and Gynecology

## 2023-11-21 DIAGNOSIS — Z3A40 40 weeks gestation of pregnancy: Secondary | ICD-10-CM

## 2023-11-21 DIAGNOSIS — O99284 Endocrine, nutritional and metabolic diseases complicating childbirth: Secondary | ICD-10-CM | POA: Diagnosis not present

## 2023-11-21 DIAGNOSIS — O321XX0 Maternal care for breech presentation, not applicable or unspecified: Secondary | ICD-10-CM

## 2023-11-21 DIAGNOSIS — O480 Post-term pregnancy: Secondary | ICD-10-CM | POA: Diagnosis not present

## 2023-11-21 LAB — RPR: RPR Ser Ql: NONREACTIVE

## 2023-11-21 SURGERY — Surgical Case
Anesthesia: Spinal

## 2023-11-21 MED ORDER — DIBUCAINE (PERIANAL) 1 % EX OINT: 1.0000 | TOPICAL_OINTMENT | CUTANEOUS | Status: DC | PRN

## 2023-11-21 MED ORDER — STERILE WATER FOR IRRIGATION IR SOLN
Status: DC | PRN
  Administered 2023-11-21: 1000 mL

## 2023-11-21 MED ORDER — MORPHINE SULFATE (PF) 0.5 MG/ML IJ SOLN
INTRAMUSCULAR | Status: AC
  Filled 2023-11-21: qty 10

## 2023-11-21 MED ORDER — COCONUT OIL OIL: 1.0000 | TOPICAL_OIL | Status: DC | PRN

## 2023-11-21 MED ORDER — SIMETHICONE 80 MG PO CHEW: 80.0000 mg | CHEWABLE_TABLET | ORAL | Status: DC | PRN

## 2023-11-21 MED ORDER — METHYLERGONOVINE MALEATE 0.2 MG PO TABS: 0.2000 mg | ORAL_TABLET | ORAL | Status: DC | PRN

## 2023-11-21 MED ORDER — SODIUM CHLORIDE 0.9 % IV SOLN
INTRAVENOUS | Status: DC | PRN
  Administered 2023-11-21: 500 mg via INTRAVENOUS

## 2023-11-21 MED ORDER — FENTANYL CITRATE (PF) 100 MCG/2ML IJ SOLN
INTRAMUSCULAR | Status: AC
  Filled 2023-11-21: qty 2

## 2023-11-21 MED ORDER — KETOROLAC TROMETHAMINE 30 MG/ML IJ SOLN
30.0000 mg | Freq: Four times a day (QID) | INTRAMUSCULAR | Status: DC | PRN
Start: 2023-11-21 — End: 2023-11-21

## 2023-11-21 MED ORDER — GENTAMICIN SULFATE 40 MG/ML IJ SOLN
5.0000 mg/kg | Freq: Once | INTRAVENOUS | Status: AC
  Administered 2023-11-21: 350 mg via INTRAVENOUS
  Filled 2023-11-21: qty 8.75

## 2023-11-21 MED ORDER — FENTANYL CITRATE (PF) 100 MCG/2ML IJ SOLN
INTRAMUSCULAR | Status: DC | PRN
  Administered 2023-11-21: 15 ug via INTRATHECAL

## 2023-11-21 MED ORDER — LEVOTHYROXINE SODIUM 75 MCG PO TABS
75.0000 ug | ORAL_TABLET | Freq: Every day | ORAL | Status: DC
  Administered 2023-11-21 – 2023-11-22 (×2): 75 ug via ORAL
  Filled 2023-11-21 (×3): qty 1

## 2023-11-21 MED ORDER — BUPIVACAINE IN DEXTROSE 0.75-8.25 % IT SOLN
INTRATHECAL | Status: DC | PRN
  Administered 2023-11-21: 1.8 mL via INTRATHECAL

## 2023-11-21 MED ORDER — MEPERIDINE HCL 25 MG/ML IJ SOLN: 6.2500 mg | INTRAMUSCULAR | Status: DC | PRN

## 2023-11-21 MED ORDER — MENTHOL 3 MG MT LOZG: 1.0000 | LOZENGE | OROMUCOSAL | Status: DC | PRN

## 2023-11-21 MED ORDER — DIPHENHYDRAMINE HCL 25 MG PO CAPS
25.0000 mg | ORAL_CAPSULE | Freq: Four times a day (QID) | ORAL | Status: DC | PRN
Start: 2023-11-21 — End: 2023-11-24

## 2023-11-21 MED ORDER — CLINDAMYCIN PHOSPHATE 900 MG/50ML IV SOLN
INTRAVENOUS | Status: AC
  Filled 2023-11-21: qty 50

## 2023-11-21 MED ORDER — DIPHENHYDRAMINE HCL 50 MG/ML IJ SOLN: 12.5000 mg | INTRAMUSCULAR | Status: DC | PRN

## 2023-11-21 MED ORDER — NALOXONE HCL 0.4 MG/ML IJ SOLN: 0.4000 mg | INTRAMUSCULAR | Status: DC | PRN

## 2023-11-21 MED ORDER — SENNOSIDES-DOCUSATE SODIUM 8.6-50 MG PO TABS
2.0000 | ORAL_TABLET | Freq: Every day | ORAL | Status: DC
  Administered 2023-11-22 – 2023-11-24 (×3): 2 via ORAL
  Filled 2023-11-21 (×3): qty 2

## 2023-11-21 MED ORDER — OXYTOCIN-SODIUM CHLORIDE 30-0.9 UT/500ML-% IV SOLN: 2.5000 [IU]/h | INTRAVENOUS | Status: AC

## 2023-11-21 MED ORDER — ONDANSETRON HCL 4 MG/2ML IJ SOLN
INTRAMUSCULAR | Status: DC | PRN
  Administered 2023-11-21: 4 mg via INTRAVENOUS

## 2023-11-21 MED ORDER — MORPHINE SULFATE (PF) 0.5 MG/ML IJ SOLN
INTRAMUSCULAR | Status: DC | PRN
  Administered 2023-11-21: .15 mg via INTRATHECAL

## 2023-11-21 MED ORDER — ACETAMINOPHEN 10 MG/ML IV SOLN
INTRAVENOUS | Status: DC | PRN
  Administered 2023-11-21: 1000 mg via INTRAVENOUS

## 2023-11-21 MED ORDER — FENTANYL CITRATE (PF) 100 MCG/2ML IJ SOLN: 25.0000 ug | INTRAMUSCULAR | Status: DC | PRN

## 2023-11-21 MED ORDER — ACETAMINOPHEN 500 MG PO TABS
1000.0000 mg | ORAL_TABLET | Freq: Four times a day (QID) | ORAL | Status: DC
  Administered 2023-11-21 – 2023-11-24 (×12): 1000 mg via ORAL
  Filled 2023-11-21 (×13): qty 2

## 2023-11-21 MED ORDER — WITCH HAZEL-GLYCERIN EX PADS: 1.0000 | MEDICATED_PAD | CUTANEOUS | Status: DC | PRN

## 2023-11-21 MED ORDER — SODIUM CHLORIDE 0.9% FLUSH: 3.0000 mL | INTRAVENOUS | Status: DC | PRN

## 2023-11-21 MED ORDER — ZOLPIDEM TARTRATE 5 MG PO TABS: 5.0000 mg | ORAL_TABLET | Freq: Every evening | ORAL | Status: DC | PRN

## 2023-11-21 MED ORDER — IBUPROFEN 600 MG PO TABS
600.0000 mg | ORAL_TABLET | Freq: Four times a day (QID) | ORAL | Status: DC
  Administered 2023-11-22 – 2023-11-24 (×9): 600 mg via ORAL
  Filled 2023-11-21 (×9): qty 1

## 2023-11-21 MED ORDER — ONDANSETRON HCL 4 MG/2ML IJ SOLN: 4.0000 mg | Freq: Three times a day (TID) | INTRAMUSCULAR | Status: DC | PRN

## 2023-11-21 MED ORDER — PRENATAL MULTIVITAMIN CH
1.0000 | ORAL_TABLET | Freq: Every day | ORAL | Status: DC
  Administered 2023-11-21 – 2023-11-24 (×4): 1 via ORAL
  Filled 2023-11-21 (×4): qty 1

## 2023-11-21 MED ORDER — DEXAMETHASONE SODIUM PHOSPHATE 10 MG/ML IJ SOLN
INTRAMUSCULAR | Status: DC | PRN
  Administered 2023-11-21: 10 mg via INTRAVENOUS

## 2023-11-21 MED ORDER — PHENYLEPHRINE HCL-NACL 20-0.9 MG/250ML-% IV SOLN
INTRAVENOUS | Status: DC | PRN
  Administered 2023-11-21: 40 ug/min via INTRAVENOUS

## 2023-11-21 MED ORDER — CLINDAMYCIN PHOSPHATE 900 MG/50ML IV SOLN
900.0000 mg | Freq: Three times a day (TID) | INTRAVENOUS | Status: AC
  Administered 2023-11-21 – 2023-11-22 (×3): 900 mg via INTRAVENOUS
  Filled 2023-11-21 (×3): qty 50

## 2023-11-21 MED ORDER — METHYLERGONOVINE MALEATE 0.2 MG/ML IJ SOLN: 0.2000 mg | INTRAMUSCULAR | Status: DC | PRN

## 2023-11-21 MED ORDER — LACTATED RINGERS IV SOLN: INTRAVENOUS | Status: DC | PRN

## 2023-11-21 MED ORDER — KETOROLAC TROMETHAMINE 30 MG/ML IJ SOLN
30.0000 mg | Freq: Four times a day (QID) | INTRAMUSCULAR | Status: AC
  Administered 2023-11-21 – 2023-11-22 (×4): 30 mg via INTRAVENOUS
  Filled 2023-11-21 (×4): qty 1

## 2023-11-21 MED ORDER — DIPHENHYDRAMINE HCL 25 MG PO CAPS: 25.0000 mg | ORAL_CAPSULE | ORAL | Status: DC | PRN

## 2023-11-21 MED ORDER — NALOXONE HCL 4 MG/10ML IJ SOLN: 1.0000 ug/kg/h | INTRAVENOUS | Status: DC | PRN

## 2023-11-21 MED ORDER — SIMETHICONE 80 MG PO CHEW
80.0000 mg | CHEWABLE_TABLET | Freq: Three times a day (TID) | ORAL | Status: DC
  Administered 2023-11-21 – 2023-11-24 (×9): 80 mg via ORAL
  Filled 2023-11-21 (×9): qty 1

## 2023-11-21 MED ORDER — KETOROLAC TROMETHAMINE 30 MG/ML IJ SOLN: 30.0000 mg | Freq: Four times a day (QID) | INTRAMUSCULAR | Status: DC | PRN

## 2023-11-21 MED ORDER — OXYTOCIN-SODIUM CHLORIDE 30-0.9 UT/500ML-% IV SOLN
INTRAVENOUS | Status: DC | PRN
  Administered 2023-11-21: 300 mL/h via INTRAVENOUS

## 2023-11-21 MED ORDER — SODIUM CHLORIDE 0.9 % IR SOLN
Status: DC | PRN
  Administered 2023-11-21: 1

## 2023-11-21 MED ORDER — OXYCODONE HCL 5 MG PO TABS: 5.0000 mg | ORAL_TABLET | ORAL | Status: DC | PRN

## 2023-11-21 MED ORDER — CLINDAMYCIN PHOSPHATE 900 MG/50ML IV SOLN
INTRAVENOUS | Status: DC | PRN
  Administered 2023-11-21: 900 mg via INTRAVENOUS

## 2023-11-21 SURGICAL SUPPLY — 30 items
BENZOIN TINCTURE PRP APPL 2/3 (GAUZE/BANDAGES/DRESSINGS) IMPLANT
CHLORAPREP W/TINT 26 (MISCELLANEOUS) ×2 IMPLANT
CLAMP UMBILICAL CORD (MISCELLANEOUS) ×1 IMPLANT
CLOTH BEACON ORANGE TIMEOUT ST (SAFETY) ×1 IMPLANT
DERMABOND ADVANCED .7 DNX12 (GAUZE/BANDAGES/DRESSINGS) IMPLANT
DRSG OPSITE POSTOP 4X10 (GAUZE/BANDAGES/DRESSINGS) ×1 IMPLANT
ELECTRODE REM PT RTRN 9FT ADLT (ELECTROSURGICAL) ×1 IMPLANT
EXTRACTOR VACUUM KIWI (MISCELLANEOUS) IMPLANT
GLOVE BIO SURGEON STRL SZ7 (GLOVE) ×1 IMPLANT
GLOVE BIOGEL PI IND STRL 7.0 (GLOVE) ×1 IMPLANT
GOWN STRL REUS W/TWL LRG LVL3 (GOWN DISPOSABLE) ×2 IMPLANT
KIT ABG SYR 3ML LUER SLIP (SYRINGE) IMPLANT
MAT PREVALON FULL STRYKER (MISCELLANEOUS) IMPLANT
NDL HYPO 25X5/8 SAFETYGLIDE (NEEDLE) IMPLANT
NEEDLE HYPO 22GX1.5 SAFETY (NEEDLE) IMPLANT
NEEDLE HYPO 25X5/8 SAFETYGLIDE (NEEDLE) IMPLANT
NS IRRIG 1000ML POUR BTL (IV SOLUTION) ×1 IMPLANT
PACK C SECTION WH (CUSTOM PROCEDURE TRAY) ×1 IMPLANT
PAD OB MATERNITY 4.3X12.25 (PERSONAL CARE ITEMS) ×1 IMPLANT
RTRCTR C-SECT PINK 25CM LRG (MISCELLANEOUS) ×1 IMPLANT
STRIP CLOSURE SKIN 1/2X4 (GAUZE/BANDAGES/DRESSINGS) IMPLANT
SUT CHROMIC 1 CTX 36 (SUTURE) ×2 IMPLANT
SUT CHROMIC 2 0 CT 1 (SUTURE) ×1 IMPLANT
SUT PDS AB 0 CTX 60 (SUTURE) ×1 IMPLANT
SUT VIC AB 2-0 CT1 TAPERPNT 27 (SUTURE) ×1 IMPLANT
SUT VIC AB 4-0 KS 27 (SUTURE) IMPLANT
SYR 30ML LL (SYRINGE) IMPLANT
TOWEL OR 17X24 6PK STRL BLUE (TOWEL DISPOSABLE) ×1 IMPLANT
TRAY FOLEY W/BAG SLVR 14FR LF (SET/KITS/TRAYS/PACK) ×1 IMPLANT
WATER STERILE IRR 1000ML POUR (IV SOLUTION) ×1 IMPLANT

## 2023-11-21 NOTE — Anesthesia Postprocedure Evaluation (Signed)
 Anesthesia Post Note  Patient: Roberta Luna  Procedure(s) Performed: CESAREAN DELIVERY     Patient location during evaluation: PACU Anesthesia Type: Spinal Level of consciousness: oriented and awake and alert Pain management: pain level controlled Vital Signs Assessment: post-procedure vital signs reviewed and stable Respiratory status: spontaneous breathing, respiratory function stable and nonlabored ventilation Cardiovascular status: blood pressure returned to baseline and stable Postop Assessment: no headache, no backache, spinal receding and no apparent nausea or vomiting Anesthetic complications: no   No notable events documented.  Last Vitals:  Vitals:   11/21/23 0730 11/21/23 0745  BP: 94/62 103/61  Pulse: 74 82  Resp: 20 20  Temp:    SpO2: 98% 98%    Last Pain:  Vitals:   11/21/23 0710  TempSrc: Oral  PainSc: 0-No pain   Pain Goal:    LLE Motor Response: No movement due to regional block, No movement to painful stimulus (11/21/23 0745) LLE Sensation: Tingling (11/21/23 0745) RLE Motor Response: No movement due to regional block, No movement to painful stimulus (11/21/23 0745) RLE Sensation: Tingling (11/21/23 0745)     Epidural/Spinal Function Cutaneous sensation: Tingles (11/21/23 0745), Patient able to flex knees: No (11/21/23 0745), Patient able to lift hips off bed: No (11/21/23 0745), Back pain beyond tenderness at insertion site: No (11/21/23 0745), Progressively worsening motor and/or sensory loss: No (11/21/23 0745), Bowel and/or bladder incontinence post epidural: No (11/21/23 0745)  Dreux Mcgroarty A.

## 2023-11-21 NOTE — Transfer of Care (Signed)
 Immediate Anesthesia Transfer of Care Note  Patient: Roberta Luna  Procedure(s) Performed: CESAREAN DELIVERY  Patient Location: PACU  Anesthesia Type:Spinal  Level of Consciousness: awake and alert   Airway & Oxygen Therapy: Patient Spontanous Breathing  Post-op Assessment: Report given to RN  Post vital signs: Reviewed and stable  Last Vitals:  Vitals Value Taken Time  BP 114/68 11/21/23 07:15  Temp    Pulse 81 11/21/23 07:16  Resp 13 11/21/23 07:16  SpO2 99 % 11/21/23 07:16  Vitals shown include unfiled device data.  Last Pain:  Vitals:   11/21/23 0432  TempSrc:   PainSc: 8          Complications: No notable events documented.

## 2023-11-21 NOTE — Plan of Care (Addendum)
 Went to check on pt and do orthostat. Pt stated that lactation had just left and could I come back in 10 minutes.

## 2023-11-21 NOTE — Anesthesia Preprocedure Evaluation (Signed)
 Anesthesia Evaluation  Patient identified by MRN, date of birth, ID band Patient awake  Preop documentation limited or incomplete due to emergent nature of procedure.  Airway Mallampati: II  TM Distance: >3 FB     Dental no notable dental hx.    Pulmonary neg pulmonary ROS   Pulmonary exam normal        Cardiovascular negative cardio ROS  Rhythm:Regular Rate:Tachycardia     Neuro/Psych negative neurological ROS     GI/Hepatic negative GI ROS, Neg liver ROS,,,  Endo/Other  Hypothyroidism  Obesity  Renal/GU      Musculoskeletal   Abdominal  (+) + obese  Peds  Hematology   Anesthesia Other Findings   Reproductive/Obstetrics (+) Pregnancy Emergency C/Section for fetal bradycardia                              Anesthesia Physical Anesthesia Plan  ASA: 2 and emergent  Anesthesia Plan: Spinal   Post-op Pain Management: Minimal or no pain anticipated and Regional block*   Induction:   PONV Risk Score and Plan: 4 or greater and Treatment may vary due to age or medical condition  Airway Management Planned: Natural Airway  Additional Equipment: Fetal Monitoring and None  Intra-op Plan:   Post-operative Plan:   Informed Consent:   Plan Discussed with: Anesthesiologist, CRNA and Surgeon  Anesthesia Plan Comments:          Anesthesia Quick Evaluation

## 2023-11-21 NOTE — Addendum Note (Signed)
 Addendum  created 11/21/23 1421 by Marquita Lonell MATSU, CRNA   Attestation recorded in [place], Intraprocedure Attestations filed, Optician, dispensing edited

## 2023-11-21 NOTE — Op Note (Signed)
 Operative Report   Pre-Operative Diagnosis: 1) 40+1-week intrauterine pregnancy 2) fetal bradycardia  Postoperative Diagnosis: 1) 40+1-week intrauterine pregnancy 2) fetal bradycardia   Procedure: Failed vacuum-assisted vaginal delivery followed by urgent primary low-transverse cesarean section  Surgeon: Dr. Marjorie Gull  Assistant: Dr. Lonell Sor  A dedicated surgical assistant was required for this case due to its complexity and urgency.  Antibiotics: Azithromycin  500 mg, clindamycin  900 mg  Operative Findings: Female infant in the vertex occiput posterior presentation with nuchal cord x 1.  Infant was delivered in the double footling breech presentation due to rotation of the infant to transverse back down lie after the fetal vertex was elevated out of the pelvis.  Apgar scores of 8 at 1 minute and 9 at 5 minutes.  Birth weight 3400 g  Specimen: Placenta for disposal  EBL: 368 mL   Procedure:Roberta Luna is an 28--year-old gravida 1 para 0 at 40 weeks and 1 days estimated gestational age who presents for cesarean section.  Patient was complete and pushing for approximately 20 minutes.  I was called to evaluate the patient and notified that fetal heart tones had been down for 10 minutes.  Upon arrival fetal heart tones were approximately 100.  In looking at the external fetal monitor tracing the fetal heart tones had been around 100 for at least 10 minutes.  Maternal heart rate was also around 100.  Fetal heart tones briefly increased to approximately 130 with cervical exam and scalp stimulation but then returned to 100.  Patient was placed in hands and knees and there was difficulty in monitoring the fetal heart rate in this position.  The fetal scalp electrode was applied however the equipment needed to connect the scalp electrode to the external fetal monitor was not available and the fetal heart rate could not be adequately assessed.  At this point a stat section was called and the patient  was rapidly transferred to the operating room.  In the operating room fetal heart rate was confirmed to be 130s and remained in the 130s.  An attempt to allow for a vaginal delivery was made.  The patient pushed for approximately 5-10 additional minutes.  A vacuum was applied to assist with expediting the delivery.  3 attempts were made, however there was minimal descent of the fetal head with maternal expulsive effort.  1 pop-off occurred.  The fetal heart rate was remaining in the 100 after pushing attempts and the decision was made to proceed at this point with cesarean delivery.  Spinal anesthesia was administered.  A Betadine splash was performed.  The patient was prepped and draped in a sterile fashion.  The patient was appropriately identified in a preoperative timeout procedure.  The scalpel was then used to make a Pfannenstiel skin incision which was carried down to the underlying layers of soft tissue to the fascia. The fascia was incised in the midline and the fascial incision was extended laterally with blunt dissection.  The rectus muscles were separated bluntly in the midline.  The abdominal peritoneum was identified and entered bluntly.  The peritoneal incision was bluntly extended.  The Alexis retractor was inserted.  A scalpel was used to make a low transverse incision on the uterus and the hysterotomy was extended bluntly.  The right arm immediately delivered through the hysterotomy.  The fetal vertex was identified and elevated.  At this time the fetal head rotated to the maternal right and the infant was now in the transverse back down lie with  the head to the maternal right.  The head would not rotate back easily to allow for delivery through the hysterotomy therefore the infant's feet were identified and grasped and the body was delivered through the uterine incision followed by the head.  Immediately after delivery the infant cried vigorously and was bulb suctioned.  A 1 minute delay in cord  clamping was performed.  Following cord clamping the infant was passed to the waiting neonatology team. The placenta was then delivered spontaneously and the uterus was cleared of all clot and debris. The uterine incision was repaired with #1 chromic in running locked fashion.  Anesthesia notified that the urine had developed frank blood during the procedure.  The bladder was retrograde filled with sterile formula.  No extravasation of formula was noted in the surgical field.  The ureters were identified and peristalsing bilaterally.  The ovaries and tubes were inspected and normal. The Alexis retractor was removed. The abdominal peritoneum was reapproximated with 2-0 Vicryl in a running fashion. The rectus muscles was reapproximated with 2-0 chromic in a running fashion. The fascia was closed with 0-looped PDS in a running fashion. The skin was closed with 4-0 vicryl in a subcuticular fashion and Dermabond. All laparotomy sponge, instrument, and needle counts were correct.  The patient tolerated the procedure well and was transferred to the recovery unit in stable condition following the procedure.

## 2023-11-21 NOTE — Anesthesia Procedure Notes (Signed)
 Spinal  Patient location during procedure: OR Start time: 11/21/2023 6:06 AM End time: 11/21/2023 6:08 AM Reason for block: surgical anesthesia Staffing Performed: anesthesiologist  Anesthesiologist: Jerrye Sharper, MD Performed by: Jerrye Sharper, MD Authorized by: Jerrye Sharper, MD   Preanesthetic Checklist Completed: patient identified, IV checked, site marked, risks and benefits discussed, surgical consent, monitors and equipment checked, pre-op evaluation and timeout performed Spinal Block Patient position: sitting Prep: DuraPrep and site prepped and draped Patient monitoring: heart rate, cardiac monitor, continuous pulse ox and blood pressure Approach: midline Location: L3-4 Injection technique: single-shot Needle Needle type: Pencan  Needle gauge: 24 G Needle length: 9 cm Needle insertion depth: 6 cm Assessment Sensory level: T4 Events: CSF return Additional Notes Patient tolerated procedure well. Adequate sensory level.

## 2023-11-21 NOTE — Progress Notes (Signed)
 NT went to room to complete orthostats on pt and pt had family in the room. Pt and family were taking pictures and pt stated could she come back later on.

## 2023-11-21 NOTE — Progress Notes (Signed)
 This RN checked patient at 30. Patient was 7/90/0 with a bulging bag of water . MD notified at 0330 of check, membrane status, and patient request for AROM. No new orders were given and MD stated that she would be around when she could for AROM. Patient SROM at 0520.

## 2023-11-21 NOTE — H&P (Signed)
 Roberta Luna is a 28 y.o. female presenting for contractions  28 year old G1 P0 at 40+1 presents to maternity admissions for painful contractions.  On admission she was found to be 5-6/70/-2 and contracting painfully every 2 to 3 minutes.  She was admitted for labor.  She was previously seen in the office earlier today and had a membrane sweep.  Her cervical exam at that time was 3/80/-2.  Pregnancy has been complicated by hypothyroidism.  She has been taking levothyroxine  100 mcg daily and has remained euthyroid. OB History     Gravida  1   Para      Term      Preterm      AB      Living         SAB      IAB      Ectopic      Multiple      Live Births             Past Medical History:  Diagnosis Date   Thyroid disease    Past Surgical History:  Procedure Laterality Date   NO PAST SURGERIES     Family History: family history is not on file. Social History:  reports that she has never smoked. She does not have any smokeless tobacco history on file. She reports that she does not currently use alcohol. She reports that she does not currently use drugs.     Maternal Diabetes: No Genetic Screening: Declined Maternal Ultrasounds/Referrals: Normal Fetal Ultrasounds or other Referrals:  None Maternal Substance Abuse:  No Significant Maternal Medications:  Meds include: Syntroid Significant Maternal Lab Results:  None Number of Prenatal Visits:greater than 3 verified prenatal visits Maternal Vaccinations:TDap Other Comments:  None  Review of Systems History Dilation: 5.5 Effacement (%): 70 Station: -2 Exam by:: Roberta Luna Blood pressure 132/81, pulse 86, temperature 98.2 F (36.8 C), temperature source Oral, resp. rate 20, height 5' 5 (1.651 m), weight 90.4 kg, SpO2 100%. Exam Physical Exam  Alert and oriented x 3, no apparent distress Gravid, soft Fetal heart rate 120, reactive, category 1 tracing Prenatal labs: ABO, Rh: --/--/O POS (08/26  2151) Antibody: NEG (08/26 2151) Rubella:  Immune RPR:   Reactive HBsAg:   Negative HIV:   Reactive GBS: Negative/-- (08/01 0000)  Results for orders placed or performed during the hospital encounter of 11/20/23 (from the past 24 hours)  CBC     Status: Abnormal   Collection Time: 11/20/23  9:51 PM  Result Value Ref Range   WBC 16.8 (H) 4.0 - 10.5 K/uL   RBC 4.84 3.87 - 5.11 MIL/uL   Hemoglobin 12.5 12.0 - 15.0 g/dL   HCT 62.5 63.9 - 53.9 %   MCV 77.3 (L) 80.0 - 100.0 fL   MCH 25.8 (L) 26.0 - 34.0 pg   MCHC 33.4 30.0 - 36.0 g/dL   RDW 85.6 88.4 - 84.4 %   Platelets 227 150 - 400 K/uL   nRBC 0.0 0.0 - 0.2 %  Type and screen [facility]     Status: None   Collection Time: 11/20/23  9:51 PM  Result Value Ref Range   ABO/RH(D) O POS    Antibody Screen NEG    Sample Expiration      11/23/2023,2359 Performed at [facility], [address] [place], [place] [address]      Assessment/Plan: 1) admit 2) epidural on request 3) expectant management   Roberta Luna 11/21/2023,  12:12 AM

## 2023-11-21 NOTE — Lactation Note (Signed)
 This note was copied from a baby's chart. Lactation Consultation Note  Patient Name: Roberta Luna Unijb'd Date: 11/21/2023 Age:30 hours Reason for consult: Initial assessment;Mother's request;Difficult latch;Primapara;1st time breastfeeding;Term;Maternal endocrine disorder;Breastfeeding assistance;RN request  P1- MOB was requesting latching assistance because infant has been very tired and is now too mad to latch. LC assisted with placing infant on the right breast in the football hold. Infant would scream and scream and would not latch. LC decided to swaddle infant to comfort him and try the latch again. Infant was able to calm down and was placed back on the right breast. MOB expressed her EBM into infant's mouth until he became interested in feeding. MOB has rolling colostrum. Infant latched well with flanged lips, would suck for 5 minutes, then pop off. MOB was able to relatch him and he nursed for 15 minutes.  LC reviewed the first 24 hr birthday nap, day 2 cluster feeding, feeding infant on cue 8-12x in 24 hrs, not allowing infant to go over 3 hrs without a feeding, CDC milk storage guidelines, LC services handout and engorgement/breast care. LC encouraged MOB to call for further assistance as needed.  Maternal Data Has patient been taught Hand Expression?: Yes Does the patient have breastfeeding experience prior to this delivery?: No  Feeding Mother's Current Feeding Choice: Breast Milk  LATCH Score Latch: Repeated attempts needed to sustain latch, nipple held in mouth throughout feeding, stimulation needed to elicit sucking reflex.  Audible Swallowing: A few with stimulation  Type of Nipple: Everted at rest and after stimulation  Comfort (Breast/Nipple): Soft / non-tender  Hold (Positioning): Full assist, staff holds infant at breast  LATCH Score: 6   Lactation Tools Discussed/Used Tools: Pump;Flanges Flange Size: 18 Breast pump type: Manual Pump Education: Setup,  frequency, and cleaning;Milk Storage Reason for Pumping: MOB request Pumping frequency: 15-20 min every 3 hrs  Interventions Interventions: Breast feeding basics reviewed;Assisted with latch;Hand express;Breast compression;Adjust position;Support pillows;Position options;Expressed milk;Hand pump;Education;LC Services brochure  Discharge Discharge Education: Engorgement and breast care;Warning signs for feeding baby Pump: DEBP;Manual;Personal (spectra  and medela)  Consult Status Consult Status: Follow-up Date: 11/22/23 Follow-up type: In-patient    Recardo Hoit BS, IBCLC 11/21/2023, 6:00 PM

## 2023-11-21 NOTE — Progress Notes (Signed)
 RN checked patient at [phone] patient complete plus 1 and pushing. MD notified. FHR reviewed by RN patient repositioned side to side as well as on hands and knees. This RN called RROB and second hands RN to bedside to review strip changes. MD notified of FHR changes and interventions. LR bolus given. MD arrived at room shortly after while patient in hands and knees. FSE placed on patient but would not register. External monitor applied to patient without success of FHR audible sound.MD called for stat c section at 0540. Arrived in OR with FHR in the 130s. MD verbal order to push for vaginal delivery while in the OR. After failed attempt to move baby down MD applied vacuum. After unsuccessful vacuum delivery, MD called for official csection at [phone].

## 2023-11-22 LAB — CBC
HCT: 30.8 % — ABNORMAL LOW (ref 36.0–46.0)
Hemoglobin: 10.2 g/dL — ABNORMAL LOW (ref 12.0–15.0)
MCH: 26.1 pg (ref 26.0–34.0)
MCHC: 33.1 g/dL (ref 30.0–36.0)
MCV: 78.8 fL — ABNORMAL LOW (ref 80.0–100.0)
Platelets: 204 K/uL (ref 150–400)
RBC: 3.91 MIL/uL (ref 3.87–5.11)
RDW: 14.9 % (ref 11.5–15.5)
WBC: 22.1 K/uL — ABNORMAL HIGH (ref 4.0–10.5)
nRBC: 0 % (ref 0.0–0.2)

## 2023-11-22 LAB — BASIC METABOLIC PANEL WITH GFR
Anion gap: 6 (ref 5–15)
BUN: 13 mg/dL (ref 6–20)
CO2: 22 mmol/L (ref 22–32)
Calcium: 8.8 mg/dL — ABNORMAL LOW (ref 8.9–10.3)
Chloride: 108 mmol/L (ref 98–111)
Creatinine, Ser: 0.83 mg/dL (ref 0.44–1.00)
GFR, Estimated: >60 mL/min (ref >60)
Glucose, Bld: 113 mg/dL — ABNORMAL HIGH (ref 70–99)
Potassium: 4.4 mmol/L (ref 3.5–5.1)
Sodium: 136 mmol/L (ref 135–145)

## 2023-11-22 MED ORDER — AMMONIA AROMATIC IN INHA
RESPIRATORY_TRACT | Status: AC
  Filled 2023-11-22: qty 10

## 2023-11-22 NOTE — Progress Notes (Signed)
 Patient sleeping

## 2023-11-22 NOTE — Progress Notes (Signed)
 Subjective: Postpartum Day 1: Cesarean Delivery Patient reports incisional pain, tolerating PO, + flatus, and no problems voiding. No BM yet. She reports ambulating better. Pain controlled with meds. Working with lactation on breastfeeding. She denies CP, SOB or HA. Feels well. Bonding with baby. Desires circumcision once baby completes bilirubin treatment  Objective: Vital signs in last 24 hours: Temp:  [97.6 F (36.4 C)-98.3 F (36.8 C)] 97.6 F (36.4 C) (08/28 0100) Pulse Rate:  [68-77] 77 (08/28 0100) Resp:  [16-18] 18 (08/28 0100) BP: (104-111)/(52-78) 108/75 (08/28 0100) SpO2:  [99 %-100 %] 99 % (08/28 0100)  Physical Exam:  General: alert, cooperative, and no distress Lochia: appropriate Uterine Fundus: firm Incision: no significant drainage DVT Evaluation: No evidence of DVT seen on physical exam. No significant calf/ankle edema.  Recent Labs    11/20/23 2151 11/22/23 0440  HGB 12.5 10.2*  HCT 37.4 30.8*    Assessment/Plan: Status post Cesarean section. Doing well postoperatively.  Continue current care [facility] for abdominal binder.  Ted LELON Solo, DO 11/22/2023, 11:24 AM

## 2023-11-22 NOTE — Progress Notes (Signed)
 Patient states that she doesn't have the urge to void. I told her after feeding her infant that she needs to get up and try to void. Patient said she would walk out in hallway and drank gingerale/cranberry juice and sugar combo. A cup of water  was given to patient to blow bubbles. Patient stated that she wanted to try other measures before being cathed.

## 2023-11-22 NOTE — Lactation Note (Signed)
 This note was copied from a baby's chart. Lactation Consultation Note  Patient Name: Roberta Luna Unijb'd Date: 11/22/2023 Age:30 hours Reason for consult: Follow-up assessment;Difficult latch;Term;Hyperbilirubinemia  P1, 40 wks, @ 29 hrs of life. RN request LC assist. Baby irritable/ over hungry. Demonstrated expressing free drops to get milk in babies stomach so baby can calmly work @ breast. Cosette to cross-cradle positioning for both breasts, finished in football to show mom options. Mom has great flow, baby chugging milk once organized/calm. Infant feeds well from both sides. Encouraged mom to call @ completion to get baby on photo therapy. Discussed expectations @ breast- Day 1- sleepy/ feed every 3 hrs/ even 10 minutes is okay, Day 2 more awake/ feeding cues/longer feeds, and cluster feeding overnights brings milk in. Highlighted breast stimulation is tied directly to milk production. Discussed hands on breast and baby, keeping baby awake @ breast. Starting with hand expression & breast compression to get baby working @ breast, and gentle stimulation to keep baby working @ breast. Encouraged EBM for nipple care post feed. LC services and milk storage shared. Encouraged mom to call for assist anytime desired.   Maternal Data Has patient been taught Hand Expression?: Yes Does the patient have breastfeeding experience prior to this delivery?: No  Feeding Mother's Current Feeding Choice: Breast Milk  LATCH Score Latch: Grasps breast easily, tongue down, lips flanged, rhythmical sucking.  Audible Swallowing: Spontaneous and intermittent  Type of Nipple: Everted at rest and after stimulation  Comfort (Breast/Nipple): Soft / non-tender  Hold (Positioning): Assistance needed to correctly position infant at breast and maintain latch. (Mom takes over with encouragement on hand placement)  LATCH Score: 9   Lactation Tools Discussed/Used Pump Education: Milk  Storage  Interventions Interventions: Breast feeding basics reviewed;Assisted with latch;Hand express;Breast compression;Support pillows;Position options;Expressed milk;Education;LC Services brochure;CDC milk storage guidelines  Discharge Pump: DEBP;Manual;Personal  Consult Status Consult Status: Follow-up Date: 11/23/23 Follow-up type: In-patient    [facility] 11/22/2023, 11:58 AM

## 2023-11-22 NOTE — Progress Notes (Signed)
 Patient eating dinner, still has visitors. Patient reminded that she needs to get up and move and walk and do pericare and orthos. Patient reminded that visiting hours are over. Patient instructed to call on her call bell when done eating.

## 2023-11-22 NOTE — Progress Notes (Signed)
 Patient encouraged to try to get up and void.

## 2023-11-23 ENCOUNTER — Encounter (HOSPITAL_COMMUNITY): Payer: Self-pay | Admitting: Obstetrics and Gynecology

## 2023-11-23 ENCOUNTER — Inpatient Hospital Stay (HOSPITAL_COMMUNITY)

## 2023-11-23 ENCOUNTER — Inpatient Hospital Stay (HOSPITAL_COMMUNITY): Admission: AD | Admit: 2023-11-23 | Payer: Self-pay | Source: Home / Self Care | Admitting: Obstetrics and Gynecology

## 2023-11-23 MED ORDER — LEVOTHYROXINE SODIUM 100 MCG PO TABS
100.0000 ug | ORAL_TABLET | Freq: Every day | ORAL | Status: DC
  Administered 2023-11-24: 100 ug via ORAL
  Filled 2023-11-23: qty 1

## 2023-11-23 MED ORDER — POLYETHYLENE GLYCOL 3350 17 G PO PACK
17.0000 g | PACK | Freq: Every day | ORAL | Status: DC
  Filled 2023-11-23 (×2): qty 1

## 2023-11-23 NOTE — Lactation Note (Signed)
 This note was copied from a baby's chart. Lactation Consultation Note  Patient Name: Roberta Luna Unijb'd Date: 11/23/2023 Age:30 hours Reason for consult: Follow-up assessment;Hyperbilirubinemia;1st time breastfeeding C/O: infant is currently on double phototherapy, MOB has decided to pump only and give infant her EBM. MOB recently pumped with a hand pump but would like to use a DEBP. MOB expressed 60 mls of breast milk. Infant was given 45 mls of EBM using extra slow flow bottle nipple. MOB will continue to feed infant by cues, 8+ times within 24 hours, skin to skin. MOB expressed 2 ounces of EBM using a hand pump. MOB would like to use the DEBP, LC set MOB up with DEBP fitted with 18 mm breast flange. MOB will continue to pump every 3 hours for 15 minutes. Infant Day 3 MOB will offer infant 45-60  mls of EBM per feeding.   Maternal Data    Feeding Mother's Current Feeding Choice: Breast Milk  LATCH Score                    Lactation Tools Discussed/Used Tools: Pump Flange Size: 18 Breast pump type: Double-Electric Breast Pump Reason for Pumping: MOB decided to pump only and give her EBM Pumping frequency: MOB will continue to pump every 3 hours for 15 minutes. Pumped volume: 60 mL  Interventions Interventions: Pace feeding;Guidelines for Milk Supply and Pumping Schedule Handout;LC Services brochure;CDC milk storage guidelines;CDC Guidelines for Breast Pump Cleaning;Education;DEBP  Discharge    Consult Status Consult Status: Follow-up Date: 11/24/23 Follow-up type: In-patient    Grayce LULLA Batter 11/23/2023, 11:17 PM

## 2023-11-23 NOTE — Progress Notes (Signed)
 Subjective: Postpartum Day 2: Cesarean Delivery Roberta Luna and Roberta Luna are processing how Roberta delivery went. They report that it felt very scary and they did not understand completely what was going on. Roberta Luna feels that she was not listened to. I provided emotional support and tried to help Roberta Luna and Roberta Luna process.   Overall Roberta Luna. She has been ambulating, voiding, and tolerating PO. She is passing gas, but has not yet had a bowel movement. Lochia is appropriate. She is hand expressing and pumping. She denies CP, SOB or HA. Feels well. Bonding with baby. Desires circumcision once baby completes bilirubin treatment  Objective: Vital signs in last 24 hours: Temp:  [98.2 F (36.8 C)-98.4 F (36.9 C)] 98.4 F (36.9 C) (08/29 0616) Pulse Rate:  [70-83] 76 (08/29 0616) Resp:  [17-18] 17 (08/29 0616) BP: (112-113)/(69-80) 112/80 (08/29 0616) SpO2:  [100 %] 100 % (08/29 0616)  Physical Exam:  General: alert, cooperative, and no distress Lochia: appropriate Uterine Fundus: firm Incision: no significant drainage. Honeycomb in place with minimal/stable strikethrough DVT Evaluation: No evidence of DVT seen on physical exam. No significant calf/ankle edema.  Recent Labs    11/20/23 2151 11/22/23 0440  HGB 12.5 10.2*  HCT 37.4 30.8*    Assessment/Plan: Roberta Luna is a 28 year old G1 now P1001 s/p STAT pCS at [redacted]w[redacted]d for fetal bradycardia after failed vacuum. -POD2 Status post Cesarean section. Doing well postoperatively. Continue current care.Ok for abdominal binder. -hypothyroidism: 100mcg daily synthroid  was both pregnancy and prepregnancy dose, ordered. -baby boy: Parents desire neonatal circumcision, R/B/A of procedure discussed at length. Parents understand that neonatal circumcision is not considered medically necessary and is elective. The risks include, but are not limited to bleeding, infection, damage to the penis, development of scar tissue, and having to have  it redone at a later date. Parents understand these risks and wish to proceed. Per bedside RN, baby not signed off for circumcision until off of bili lights, potentially tomorrow.   Dispo: Anticipate discharge on POD3  Roberta DELENA Husky, MD 11/23/2023, 2:17 PM

## 2023-11-24 MED ORDER — IBUPROFEN 800 MG PO TABS: 800.0000 mg | ORAL_TABLET | Freq: Three times a day (TID) | ORAL | 0 refills | Status: AC

## 2023-11-24 MED ORDER — POLYETHYLENE GLYCOL 3350 17 GM/SCOOP PO POWD: 17.0000 g | Freq: Every day | ORAL | Status: AC

## 2023-11-24 MED ORDER — OXYCODONE HCL 5 MG PO TABS: 5.0000 mg | ORAL_TABLET | ORAL | 0 refills | Status: AC | PRN

## 2023-11-24 MED ORDER — OXYCODONE HCL 5 MG PO TABS
5.0000 mg | ORAL_TABLET | ORAL | 0 refills | Status: DC | PRN
Start: 2023-11-24 — End: 2023-11-24

## 2023-11-24 MED ORDER — ACETAMINOPHEN 500 MG PO TABS
1000.0000 mg | ORAL_TABLET | Freq: Three times a day (TID) | ORAL | 0 refills | Status: AC
Start: 2023-11-24 — End: ?

## 2023-11-24 NOTE — Progress Notes (Signed)
 Subjective: Postpartum Day 3: Cesarean Delivery Patient reports doing better today. Lochia normal. Ambulating, tolerating PO. +flatus. Voiding without difficulty. +incisional pain  Objective: Vital signs in last 24 hours: Temp:  [97.8 F (36.6 C)-98.5 F (36.9 C)] 97.8 F (36.6 C) (08/30 0556) Pulse Rate:  [81-86] 81 (08/30 0556) Resp:  [18] 18 (08/30 0556) BP: (115-120)/(80-87) 115/85 (08/30 0556) SpO2:  [100 %] 100 % (08/30 0556)  Physical Exam:  General: alert, cooperative, and no distress Lochia: appropriate Uterine Fundus: firm Incision: Honeycomb in place, 1 cm strikethrough on right incision (stable within marked area) DVT Evaluation: No cords or calf tenderness, 1+ edema bilaterally  Recent Labs    11/22/23 0440  HGB 10.2*  HCT 30.8*    Assessment/Plan: 28 yo G1P1001 POD#3 s/p stat PCS for fetal bradycardia  - PP: routine care. Offered gabapentin for incisional pain, declines  - Hypothyroidism: 100 mcg synthroid  pregnancy and pre-pregnancy, continue dose  - Circumcision today, reviewed R/B  - Dispo: DC home today   Larraine DELENA Sharps, DO 11/24/2023, 11:52 AM

## 2023-11-24 NOTE — Discharge Summary (Signed)
 Postpartum Discharge Summary  Date of Service updated     Patient Name: Roberta Luna DOB: 08-06-1995 MRN: [id number]  Date of admission: 11/20/2023 Delivery date:11/21/2023 Delivering provider: OKEY LEADER Date of discharge: 11/24/2023  Admitting diagnosis: Pregnant and not yet delivered in third trimester [Z34.93] Cesarean delivery delivered [O82] Intrauterine pregnancy: [redacted]w[redacted]d     Secondary diagnosis:  Principal Problem:   Pregnant and not yet delivered in third trimester Active Problems:   Cesarean delivery delivered  Additional problems: hypothyroidism in pregnancy    Discharge diagnosis: Term Pregnancy Delivered and hypothyroidiism                                              Post partum procedures:N/A Augmentation: N/A Complications: None  Hospital course: Onset of Labor With Unplanned C/S   28 y.o. yo G1P1001 at [redacted]w[redacted]d was admitted in Latent Labor on 11/20/2023. She had spontaneous rupture of membranes progressed normally to complete dilation.The patient went for cesarean section due to fetal bradycardia in the setting of failed vacuum. Delivery details as follows: Membrane Rupture Time/Date: 5:20 AM,11/21/2023  Delivery Method:C-Section, Low Transverse Operative Delivery:Failed VAVD, none during cesarean. Details of operation can be found in separate operative note. Patient had an uncomplicated postpartum course.  She is ambulating,tolerating a regular diet, passing flatus, and urinating well.  Patient is discharged home in stable condition 11/24/23.  Newborn Data: Birth date:11/21/2023 Birth time:6:14 AM Gender:Female Living status:Living Apgars:8 ,9  Weight:3400 g  Magnesium Sulfate received: No BMZ received: No Rhophylac:N/A MMR:N/A T-DaP:Given prenatally Flu: No RSV Vaccine received: No Transfusion:No Immunizations administered: Immunization History  Administered Date(s) Administered   PFIZER(Purple Top)SARS-COV-2 Vaccination 06/19/2019, 07/14/2019     Physical exam  Vitals:   11/23/23 0616 11/23/23 1515 11/23/23 2115 11/24/23 0556  BP: 112/80 115/87 120/80 115/85  Pulse: 76 86 83 81  Resp: 17 18 18 18   Temp: 98.4 F (36.9 C) 98.5 F (36.9 C) 98.4 F (36.9 C) 97.8 F (36.6 C)  TempSrc: Oral Oral Oral Oral  SpO2: 100% 100% 100% 100%  Weight:      Height:       General: alert, cooperative, and no distress Lochia: appropriate Uterine Fundus: firm Incision: Dressing is clean, dry, and intact DVT Evaluation: No evidence of DVT seen on physical exam. Labs: Lab Results  Component Value Date   WBC 22.1 (H) 11/22/2023   HGB 10.2 (L) 11/22/2023   HCT 30.8 (L) 11/22/2023   MCV 78.8 (L) 11/22/2023   PLT 204 11/22/2023      Latest Ref Rng & Units 11/22/2023    4:40 AM  CMP  Glucose 70 - 99 mg/dL 886   BUN 6 - 20 mg/dL 13   Creatinine 9.55 - 1.00 mg/dL 9.16   Sodium 864 - 854 mmol/L 136   Potassium 3.5 - 5.1 mmol/L 4.4   Chloride 98 - 111 mmol/L 108   CO2 22 - 32 mmol/L 22   Calcium 8.9 - 10.3 mg/dL 8.8    Edinburgh Score:    11/21/2023    9:20 AM  Edinburgh Postnatal Depression Scale Screening Tool  I have been able to laugh and see the funny side of things. --      After visit meds:  Allergies as of 11/24/2023       Reactions   Amoxicillin Hives   Penicillins  Medication List     TAKE these medications    acetaminophen  500 MG tablet Commonly known as: TYLENOL  Take 2 tablets (1,000 mg total) by mouth 3 (three) times daily before meals.   ibuprofen  800 MG tablet Commonly known as: ADVIL  Take 1 tablet (800 mg total) by mouth 3 (three) times daily before meals.   levothyroxine  75 MCG tablet Commonly known as: SYNTHROID  Take 100 mcg by mouth daily before breakfast.   multivitamin-prenatal 27-0.8 MG Tabs tablet Take 1 tablet by mouth daily at 12 noon.   oxyCODONE  5 MG immediate release tablet Commonly known as: Oxy IR/ROXICODONE  Take 1 tablet (5 mg total) by mouth every 4 (four) hours as  needed for breakthrough pain.   polyethylene glycol powder 17 GM/SCOOP powder Commonly known as: MiraLax  Take 17 g by mouth daily.               Discharge Care Instructions  (From admission, onward)           Start     Ordered   11/24/23 0000  Discharge wound care:       Comments: Remove bandage on 9/2   11/24/23 1203             Discharge home in stable condition Infant Feeding: Breast Infant Disposition:home with mother Discharge instruction: per After Visit Summary and Postpartum booklet. Activity: Advance as tolerated. Pelvic rest for 6 weeks.  Diet: routine diet Anticipated Birth Control: Unsure Postpartum Appointment:6 weeks Additional Postpartum F/U: Incision check 2 weeks and postpartum depression check Future Appointments:No future appointments. Follow up Visit:  Follow-up Information     Ob/Gyn, Landy Stains. Go in 2 week(s).   Why: For wound re-check Contact information: [address] [place] [address] [phone]                     11/24/2023 Larraine DELENA Sharps, DO

## 2023-11-24 NOTE — Lactation Note (Signed)
 This note was copied from a baby's chart. Lactation Consultation Note  Patient Name: Roberta Luna Date: 11/24/2023 Age:30 hours Reason for consult: Follow-up assessment;Primapara;1st time breastfeeding;Term;Infant weight loss;Maternal endocrine disorder;Hyperbilirubinemia  P1- MOB reports that she thinks she may switch to exclusive pumping because she likes knowing the exact volume infant is taking in. LC reviewed a good schedule for exclusive pumping. LC praised MOB for her volume and commitment to providing breast milk to infant. MOB reports that her breasts have been filling, but she would like for LC to feel them to ensure they are not engorged. LC palpated MOB's breasts and they felt heavier than when this LC last saw her, but they were soft. MOB denies having any questions or concerns at this time. LC reviewed cluster feeding and engorgement/breast care. LC encouraged MOB to call for further assistance as needed.  Maternal Data Has patient been taught Hand Expression?: Yes Does the patient have breastfeeding experience prior to this delivery?: No  Feeding Mother's Current Feeding Choice: Breast Milk Nipple Type: Slow - flow  Lactation Tools Discussed/Used Tools: Pump;Flanges Pump Education: Setup, frequency, and cleaning;Milk Storage  Interventions Interventions: Breast feeding basics reviewed;Hand pump;DEBP;Education;LC Services brochure  Discharge Discharge Education: Engorgement and breast care;Warning signs for feeding baby Pump: DEBP;Manual;Personal  Consult Status Consult Status: Complete Date: 11/24/23    Recardo Hoit BS, IBCLC 11/24/2023, 12:08 PM

## 2023-11-24 NOTE — Discharge Instructions (Signed)
 Call office with any concerns [phone]

## 2023-12-04 ENCOUNTER — Telehealth (HOSPITAL_COMMUNITY): Payer: Self-pay | Admitting: *Deleted

## 2023-12-04 NOTE — Telephone Encounter (Signed)
 12/04/2023  Name: Vikkie Jagdish Brightbill MRN: [id number] DOB: [birth year]  Reason for Call:  Transition of Care Hospital Discharge Call  Contact Status: Patient Contact Status: Message  Language assistant needed:          Follow-Up Questions:    Van Postnatal Depression Scale:  In the Past 7 Days:    PHQ2-9 Depression Scale:     Discharge Follow-up:    Post-discharge interventions: NA  Mliss Sieve, RN 12/04/2023 10:57

## 2024-02-06 ENCOUNTER — Ambulatory Visit: Payer: Self-pay | Admitting: Podiatry

## 2024-02-07 ENCOUNTER — Ambulatory Visit (INDEPENDENT_AMBULATORY_CARE_PROVIDER_SITE_OTHER): Payer: Self-pay | Admitting: Podiatry

## 2024-02-07 VITALS — Ht 65.0 in | Wt 199.4 lb

## 2024-02-07 DIAGNOSIS — L600 Ingrowing nail: Secondary | ICD-10-CM

## 2024-02-07 MED ORDER — NEOMYCIN-POLYMYXIN-HC 3.5-10000-1 OT SUSP: OTIC | 0 refills | Status: AC

## 2024-02-07 NOTE — Patient Instructions (Signed)

## 2024-02-08 ENCOUNTER — Encounter: Payer: Self-pay | Admitting: Podiatry

## 2024-02-10 ENCOUNTER — Encounter: Payer: Self-pay | Admitting: Podiatry

## 2024-02-10 NOTE — Progress Notes (Signed)
  Subjective:  Patient ID: Roberta Luna, female    DOB: 17-Feb-1996,  MRN: [id number]  Chief Complaint  Patient presents with   Nail Problem    RM 17 Patient is here for ingrown toe nail of the left and right hallux on the lateral sides.    28 y.o. female presents with the above complaint. History confirmed with patient.  This worsened for her recently during her recent pregnancy  Objective:  Physical Exam: warm, good capillary refill, no trophic changes or ulcerative lesions, normal DP and PT pulses, and normal sensory exam.  Ingrown hallux lateral border bilateral  Assessment:   1. Ingrowing left great toenail   2. Ingrowing right great toenail      Plan:  Patient was evaluated and treated and all questions answered.     Ingrown Nail, bilaterally -Patient elects to proceed with minor surgery to remove ingrown toenail today. Consent reviewed and signed by patient. -Ingrown nail excised. See procedure note. -Educated on post-procedure care including soaking. Written instructions provided and reviewed. -Rx for Cortisporin sent to pharmacy. -Advised on signs and symptoms of infection developing.  We discussed that the phenol likely will create some redness and edema and tenderness around the nailbed as long as it is localized this is to be expected.  Will return as needed if any infection signs develop  Procedure: Excision of Ingrown Toenail Location: Bilateral 1st toe lateral nail borders. Anesthesia: Lidocaine  1% plain; 1.5 mL and Marcaine  0.5% plain; 1.5 mL, digital block. Skin Prep: Betadine. Dressing: Silvadene; telfa; dry, sterile, compression dressing. Technique: Following skin prep, the toe was exsanguinated and a tourniquet was secured at the base of the toe. The affected nail border was freed, split with a nail splitter, and excised. Chemical matrixectomy was then performed with phenol and irrigated out with alcohol. The tourniquet was then removed and sterile dressing  applied. Disposition: Patient tolerated procedure well.   Return if symptoms worsen or fail to improve.
